# Patient Record
Sex: Male | Born: 1960 | Race: White | Hispanic: No | Marital: Married | Smoking: Former smoker
Health system: Southern US, Community
[De-identification: ages and names within clinical notes are randomized; demographics above are authoritative.]

## PROBLEM LIST (undated history)

## (undated) DIAGNOSIS — G4730 Sleep apnea, unspecified: Secondary | ICD-10-CM

## (undated) DIAGNOSIS — F419 Anxiety disorder, unspecified: Secondary | ICD-10-CM

## (undated) DIAGNOSIS — K219 Gastro-esophageal reflux disease without esophagitis: Secondary | ICD-10-CM

## (undated) DIAGNOSIS — M1990 Unspecified osteoarthritis, unspecified site: Secondary | ICD-10-CM

## (undated) DIAGNOSIS — I10 Essential (primary) hypertension: Secondary | ICD-10-CM

## (undated) HISTORY — PX: KNEE ARTHROSCOPY: SUR90

---

## 1997-04-30 HISTORY — PX: KNEE SURGERY: SHX244

## 2005-04-30 HISTORY — PX: HERNIA REPAIR: SHX51

## 2010-04-28 ENCOUNTER — Ambulatory Visit
Admission: RE | Admit: 2010-04-28 | Discharge: 2010-04-28 | Payer: Self-pay | Source: Home / Self Care | Attending: Orthopedic Surgery | Admitting: Orthopedic Surgery

## 2010-04-30 HISTORY — PX: OTHER SURGICAL HISTORY: SHX169

## 2015-07-29 ENCOUNTER — Other Ambulatory Visit: Payer: Self-pay | Admitting: Neurosurgery

## 2015-09-22 ENCOUNTER — Encounter (HOSPITAL_COMMUNITY): Payer: Self-pay

## 2015-09-22 ENCOUNTER — Encounter (HOSPITAL_COMMUNITY)
Admission: RE | Admit: 2015-09-22 | Discharge: 2015-09-22 | Disposition: A | Discharge status: Home / Self Care | Payer: BLUE CROSS/BLUE SHIELD | Source: Ambulatory Visit | Attending: Neurosurgery | Admitting: Neurosurgery

## 2015-09-22 ENCOUNTER — Other Ambulatory Visit (HOSPITAL_COMMUNITY): Payer: Self-pay | Admitting: *Deleted

## 2015-09-22 DIAGNOSIS — Z01818 Encounter for other preprocedural examination: Secondary | ICD-10-CM | POA: Diagnosis not present

## 2015-09-22 DIAGNOSIS — R001 Bradycardia, unspecified: Secondary | ICD-10-CM | POA: Insufficient documentation

## 2015-09-22 DIAGNOSIS — M4317 Spondylolisthesis, lumbosacral region: Secondary | ICD-10-CM | POA: Insufficient documentation

## 2015-09-22 DIAGNOSIS — Z0183 Encounter for blood typing: Secondary | ICD-10-CM | POA: Insufficient documentation

## 2015-09-22 DIAGNOSIS — Z01812 Encounter for preprocedural laboratory examination: Secondary | ICD-10-CM | POA: Diagnosis not present

## 2015-09-22 HISTORY — DX: Gastro-esophageal reflux disease without esophagitis: K21.9

## 2015-09-22 HISTORY — DX: Anxiety disorder, unspecified: F41.9

## 2015-09-22 HISTORY — DX: Unspecified osteoarthritis, unspecified site: M19.90

## 2015-09-22 HISTORY — DX: Essential (primary) hypertension: I10

## 2015-09-22 HISTORY — DX: Sleep apnea, unspecified: G47.30

## 2015-09-22 LAB — BASIC METABOLIC PANEL
ANION GAP: 8 (ref 5–15)
BUN: 13 mg/dL (ref 6–20)
CHLORIDE: 105 mmol/L (ref 101–111)
CO2: 25 mmol/L (ref 22–32)
CREATININE: 0.98 mg/dL (ref 0.61–1.24)
Calcium: 9.8 mg/dL (ref 8.9–10.3)
GFR calc non Af Amer: >60 mL/min (ref >60)
GLUCOSE: 107 mg/dL — AB (ref 65–99)
POTASSIUM: 3.8 mmol/L (ref 3.5–5.1)
Sodium: 138 mmol/L (ref 135–145)

## 2015-09-22 LAB — CBC
HEMATOCRIT: 44.8 % (ref 39.0–52.0)
Hemoglobin: 15.4 g/dL (ref 13.0–17.0)
MCH: 30.5 pg (ref 26.0–34.0)
MCHC: 34.4 g/dL (ref 30.0–36.0)
MCV: 88.7 fL (ref 78.0–100.0)
PLATELETS: 149 10*3/uL — AB (ref 150–400)
RBC: 5.05 MIL/uL (ref 4.22–5.81)
RDW: 12.9 % (ref 11.5–15.5)
WBC: 7.4 10*3/uL (ref 4.0–10.5)

## 2015-09-22 LAB — TYPE AND SCREEN
ABO/RH(D): O POS
Antibody Screen: NEGATIVE

## 2015-09-22 LAB — SURGICAL PCR SCREEN
MRSA, PCR: NEGATIVE
STAPHYLOCOCCUS AUREUS: NEGATIVE

## 2015-09-22 LAB — ABO/RH: ABO/RH(D): O POS

## 2015-09-22 NOTE — Progress Notes (Signed)
Pt denies any cardiac history.  States had a stress test many years ago >10 and it was normal. Unsure where or when.  PCP Dr Salvatore DecentPaul Settle   [place] Records requested.  EKG today.

## 2015-09-22 NOTE — Pre-Procedure Instructions (Signed)
    Seth MedalKevin Hampton  09/22/2015      [id number] [place] [facility]- [place], [place] - [address] [place] [id number] Phone: [phone] Fax: [phone]    Your procedure is scheduled on Tuesday June 6th  [facility] Admitting at [employer]6am  Call this number if you have problems the morning of surgery:  [phone]  If any questions prior to surgery you may call [phone] [phone] between 8 and 4.   Remember:  Do not eat food or drink liquids after midnight.  Take these medicines the morning of surgery with A SIP OF WATER: Omeprazole (prilosec), pain pill if needed, Tizanidine if needed Stop taking any aspirin or aspirin containing products, Nsaids (such as motrin, ibuprofen, aleve, naproxen), vitamins and herbal supplements 7 days prior to surgery   Do not wear jewelry  Do not wear lotions, or cologne.  You may wear deodorant.  Men may shave face and neck.  Do not bring valuables to the hospital.  [facility] is not responsible for any belongings or valuables.  Contacts, dentures or bridgework may not be worn into surgery.  Leave your suitcase in the car.  After surgery it may be brought to your room.  Special instructions: Shower with CHG the night before and morning of surgery with CHG as instructed  Please read over the following fact sheets that you were given. Coughing and Deep Breathing, Blood Transfusion Information and MRSA Information

## 2015-10-03 MED ORDER — CEFAZOLIN SODIUM-DEXTROSE 2-4 GM/100ML-% IV SOLN
2.0000 g | INTRAVENOUS | Status: AC
  Administered 2015-10-04: 2 g via INTRAVENOUS
  Filled 2015-10-03: qty 100

## 2015-10-04 ENCOUNTER — Inpatient Hospital Stay (HOSPITAL_COMMUNITY)
Admission: RE | Admit: 2015-10-04 | Discharge: 2015-10-06 | DRG: 460 | Disposition: A | Discharge status: Home / Self Care | Payer: BLUE CROSS/BLUE SHIELD | Source: Ambulatory Visit | Attending: Neurosurgery | Admitting: Neurosurgery

## 2015-10-04 ENCOUNTER — Encounter (HOSPITAL_COMMUNITY)
Admission: RE | Disposition: A | Discharge status: Home / Self Care | Payer: Self-pay | Source: Ambulatory Visit | Attending: Neurosurgery

## 2015-10-04 ENCOUNTER — Inpatient Hospital Stay (HOSPITAL_COMMUNITY): Payer: BLUE CROSS/BLUE SHIELD | Admitting: Certified Registered Nurse Anesthetist

## 2015-10-04 ENCOUNTER — Encounter (HOSPITAL_COMMUNITY): Payer: Self-pay | Admitting: Certified Registered Nurse Anesthetist

## 2015-10-04 ENCOUNTER — Inpatient Hospital Stay (HOSPITAL_COMMUNITY): Payer: BLUE CROSS/BLUE SHIELD

## 2015-10-04 DIAGNOSIS — M4316 Spondylolisthesis, lumbar region: Secondary | ICD-10-CM | POA: Diagnosis present

## 2015-10-04 DIAGNOSIS — M545 Low back pain: Secondary | ICD-10-CM | POA: Diagnosis present

## 2015-10-04 DIAGNOSIS — M4807 Spinal stenosis, lumbosacral region: Secondary | ICD-10-CM | POA: Diagnosis present

## 2015-10-04 DIAGNOSIS — Z419 Encounter for procedure for purposes other than remedying health state, unspecified: Secondary | ICD-10-CM

## 2015-10-04 HISTORY — PX: MAXIMUM ACCESS (MAS)POSTERIOR LUMBAR INTERBODY FUSION (PLIF) 1 LEVEL: SHX6368

## 2015-10-04 SURGERY — FOR MAXIMUM ACCESS (MAS) POSTERIOR LUMBAR INTERBODY FUSION (PLIF) 1 LEVEL
Anesthesia: General | Site: Back

## 2015-10-04 MED ORDER — FENTANYL CITRATE (PF) 100 MCG/2ML IJ SOLN
INTRAMUSCULAR | Status: DC | PRN
  Administered 2015-10-04: 50 ug via INTRAVENOUS
  Administered 2015-10-04 (×2): 100 ug via INTRAVENOUS
  Administered 2015-10-04 (×3): 50 ug via INTRAVENOUS
  Administered 2015-10-04: 100 ug via INTRAVENOUS

## 2015-10-04 MED ORDER — ACETAMINOPHEN 325 MG PO TABS: 650.0000 mg | ORAL_TABLET | ORAL | Status: DC | PRN

## 2015-10-04 MED ORDER — LIDOCAINE-EPINEPHRINE 1 %-1:100000 IJ SOLN
INTRAMUSCULAR | Status: DC | PRN
  Administered 2015-10-04: 10 mL

## 2015-10-04 MED ORDER — TIZANIDINE HCL 4 MG PO TABS: 2.0000 mg | ORAL_TABLET | Freq: Four times a day (QID) | ORAL | Status: DC | PRN

## 2015-10-04 MED ORDER — ALUM & MAG HYDROXIDE-SIMETH 200-200-20 MG/5ML PO SUSP: 30.0000 mL | Freq: Four times a day (QID) | ORAL | Status: DC | PRN

## 2015-10-04 MED ORDER — METHOCARBAMOL 500 MG PO TABS
500.0000 mg | ORAL_TABLET | Freq: Four times a day (QID) | ORAL | Status: DC | PRN
  Administered 2015-10-04 – 2015-10-06 (×6): 500 mg via ORAL
  Filled 2015-10-04 (×6): qty 1

## 2015-10-04 MED ORDER — DOCUSATE SODIUM 100 MG PO CAPS
100.0000 mg | ORAL_CAPSULE | Freq: Two times a day (BID) | ORAL | Status: DC
  Administered 2015-10-04 – 2015-10-06 (×4): 100 mg via ORAL
  Filled 2015-10-04 (×4): qty 1

## 2015-10-04 MED ORDER — BISACODYL 10 MG RE SUPP: 10.0000 mg | Freq: Every day | RECTAL | Status: DC | PRN

## 2015-10-04 MED ORDER — ONDANSETRON HCL 4 MG/2ML IJ SOLN
INTRAMUSCULAR | Status: DC | PRN
  Administered 2015-10-04: 4 mg via INTRAVENOUS

## 2015-10-04 MED ORDER — FENTANYL CITRATE (PF) 250 MCG/5ML IJ SOLN
INTRAMUSCULAR | Status: AC
  Filled 2015-10-04: qty 5

## 2015-10-04 MED ORDER — LACTATED RINGERS IV SOLN
INTRAVENOUS | Status: DC
  Administered 2015-10-04 (×3): via INTRAVENOUS

## 2015-10-04 MED ORDER — HYDROMORPHONE HCL 1 MG/ML IJ SOLN
0.5000 mg | INTRAMUSCULAR | Status: DC | PRN
  Administered 2015-10-04: 1 mg via INTRAVENOUS
  Filled 2015-10-04: qty 1

## 2015-10-04 MED ORDER — SENNOSIDES-DOCUSATE SODIUM 8.6-50 MG PO TABS
1.0000 | ORAL_TABLET | Freq: Every evening | ORAL | Status: DC | PRN
  Administered 2015-10-06: 1 via ORAL
  Filled 2015-10-04: qty 1

## 2015-10-04 MED ORDER — SODIUM CHLORIDE 0.9% FLUSH
3.0000 mL | Freq: Two times a day (BID) | INTRAVENOUS | Status: DC
Start: 2015-10-04 — End: 2015-10-06
  Administered 2015-10-04 – 2015-10-05 (×3): 3 mL via INTRAVENOUS

## 2015-10-04 MED ORDER — PANTOPRAZOLE SODIUM 40 MG PO TBEC
40.0000 mg | DELAYED_RELEASE_TABLET | Freq: Every day | ORAL | Status: DC
  Administered 2015-10-05 – 2015-10-06 (×2): 40 mg via ORAL
  Filled 2015-10-04 (×2): qty 1

## 2015-10-04 MED ORDER — SODIUM CHLORIDE 0.9 % IV SOLN: 250.0000 mL | INTRAVENOUS | Status: DC

## 2015-10-04 MED ORDER — KCL IN DEXTROSE-NACL 20-5-0.45 MEQ/L-%-% IV SOLN
INTRAVENOUS | Status: DC
Start: 2015-10-04 — End: 2015-10-06

## 2015-10-04 MED ORDER — SODIUM CHLORIDE 0.9% FLUSH: 3.0000 mL | INTRAVENOUS | Status: DC | PRN

## 2015-10-04 MED ORDER — PROPOFOL 10 MG/ML IV BOLUS
INTRAVENOUS | Status: AC
  Filled 2015-10-04: qty 20

## 2015-10-04 MED ORDER — LACTATED RINGERS IV SOLN: INTRAVENOUS | Status: DC

## 2015-10-04 MED ORDER — PROMETHAZINE HCL 25 MG/ML IJ SOLN
INTRAMUSCULAR | Status: AC
  Filled 2015-10-04: qty 1

## 2015-10-04 MED ORDER — ONDANSETRON HCL 4 MG/2ML IJ SOLN
4.0000 mg | INTRAMUSCULAR | Status: DC | PRN
  Administered 2015-10-05 – 2015-10-06 (×2): 4 mg via INTRAVENOUS
  Filled 2015-10-04 (×2): qty 2

## 2015-10-04 MED ORDER — CEFAZOLIN SODIUM-DEXTROSE 2-4 GM/100ML-% IV SOLN
2.0000 g | Freq: Three times a day (TID) | INTRAVENOUS | Status: AC
  Administered 2015-10-04 – 2015-10-05 (×2): 2 g via INTRAVENOUS
  Filled 2015-10-04 (×2): qty 100

## 2015-10-04 MED ORDER — MIDAZOLAM HCL 5 MG/5ML IJ SOLN
INTRAMUSCULAR | Status: DC | PRN
  Administered 2015-10-04: 2 mg via INTRAVENOUS

## 2015-10-04 MED ORDER — MENTHOL 3 MG MT LOZG: 1.0000 | LOZENGE | OROMUCOSAL | Status: DC | PRN

## 2015-10-04 MED ORDER — OXYCODONE-ACETAMINOPHEN 5-325 MG PO TABS: 1.0000 | ORAL_TABLET | Freq: Four times a day (QID) | ORAL | Status: DC | PRN

## 2015-10-04 MED ORDER — SUCCINYLCHOLINE CHLORIDE 20 MG/ML IJ SOLN
INTRAMUSCULAR | Status: DC | PRN
  Administered 2015-10-04: 160 mg via INTRAVENOUS

## 2015-10-04 MED ORDER — 0.9 % SODIUM CHLORIDE (POUR BTL) OPTIME
TOPICAL | Status: DC | PRN
  Administered 2015-10-04: 1000 mL

## 2015-10-04 MED ORDER — BUPIVACAINE LIPOSOME 1.3 % IJ SUSP
20.0000 mL | INTRAMUSCULAR | Status: DC
  Filled 2015-10-04: qty 20

## 2015-10-04 MED ORDER — HYDROCODONE-ACETAMINOPHEN 5-325 MG PO TABS: 1.0000 | ORAL_TABLET | ORAL | Status: DC | PRN

## 2015-10-04 MED ORDER — MIDAZOLAM HCL 2 MG/2ML IJ SOLN
INTRAMUSCULAR | Status: AC
  Filled 2015-10-04: qty 2

## 2015-10-04 MED ORDER — FAMOTIDINE IN NACL 20-0.9 MG/50ML-% IV SOLN
20.0000 mg | Freq: Two times a day (BID) | INTRAVENOUS | Status: DC
  Administered 2015-10-04 – 2015-10-05 (×2): 20 mg via INTRAVENOUS
  Filled 2015-10-04 (×6): qty 50

## 2015-10-04 MED ORDER — ARTIFICIAL TEARS OP OINT
TOPICAL_OINTMENT | OPHTHALMIC | Status: DC | PRN
  Administered 2015-10-04: 1 via OPHTHALMIC

## 2015-10-04 MED ORDER — HYDROMORPHONE HCL 1 MG/ML IJ SOLN: 0.2500 mg | INTRAMUSCULAR | Status: DC | PRN

## 2015-10-04 MED ORDER — BUPIVACAINE HCL (PF) 0.5 % IJ SOLN
INTRAMUSCULAR | Status: DC | PRN
  Administered 2015-10-04: 10 mL

## 2015-10-04 MED ORDER — THROMBIN 20000 UNITS EX SOLR
CUTANEOUS | Status: DC | PRN
  Administered 2015-10-04: 12:00:00 via TOPICAL

## 2015-10-04 MED ORDER — MIDAZOLAM HCL 2 MG/2ML IJ SOLN: 0.5000 mg | Freq: Once | INTRAMUSCULAR | Status: DC | PRN

## 2015-10-04 MED ORDER — MEPERIDINE HCL 25 MG/ML IJ SOLN: 6.2500 mg | INTRAMUSCULAR | Status: DC | PRN

## 2015-10-04 MED ORDER — METHOCARBAMOL 1000 MG/10ML IJ SOLN
500.0000 mg | Freq: Four times a day (QID) | INTRAVENOUS | Status: DC | PRN
  Filled 2015-10-04: qty 5

## 2015-10-04 MED ORDER — PHENOL 1.4 % MT LIQD: 1.0000 | OROMUCOSAL | Status: DC | PRN

## 2015-10-04 MED ORDER — PROPOFOL 10 MG/ML IV BOLUS
INTRAVENOUS | Status: DC | PRN
  Administered 2015-10-04: 70 mg via INTRAVENOUS
  Administered 2015-10-04: 200 mg via INTRAVENOUS
  Administered 2015-10-04: 30 mg via INTRAVENOUS

## 2015-10-04 MED ORDER — FLEET ENEMA 7-19 GM/118ML RE ENEM: 1.0000 | ENEMA | Freq: Once | RECTAL | Status: DC | PRN

## 2015-10-04 MED ORDER — ACETAMINOPHEN 650 MG RE SUPP: 650.0000 mg | RECTAL | Status: DC | PRN

## 2015-10-04 MED ORDER — ZOLPIDEM TARTRATE 5 MG PO TABS: 5.0000 mg | ORAL_TABLET | Freq: Every evening | ORAL | Status: DC | PRN

## 2015-10-04 MED ORDER — PROMETHAZINE HCL 25 MG/ML IJ SOLN
6.2500 mg | INTRAMUSCULAR | Status: DC | PRN
  Administered 2015-10-04: 6.25 mg via INTRAVENOUS

## 2015-10-04 MED ORDER — OXYCODONE-ACETAMINOPHEN 5-325 MG PO TABS
1.0000 | ORAL_TABLET | ORAL | Status: DC | PRN
  Administered 2015-10-04 – 2015-10-06 (×9): 2 via ORAL
  Filled 2015-10-04 (×9): qty 2

## 2015-10-04 MED ORDER — EPHEDRINE SULFATE 50 MG/ML IJ SOLN
INTRAMUSCULAR | Status: DC | PRN
  Administered 2015-10-04: 15 mg via INTRAVENOUS
  Administered 2015-10-04: 10 mg via INTRAVENOUS

## 2015-10-04 MED ORDER — LIDOCAINE HCL (CARDIAC) 20 MG/ML IV SOLN
INTRAVENOUS | Status: DC | PRN
  Administered 2015-10-04: 20 mg via INTRAVENOUS

## 2015-10-04 SURGICAL SUPPLY — 87 items
BENZOIN TINCTURE PRP APPL 2/3 (GAUZE/BANDAGES/DRESSINGS) ×3 IMPLANT
BIT DRILL PLIF MAS 5.0MM DISP (DRILL) ×1 IMPLANT
BLADE CLIPPER SURG (BLADE) IMPLANT
BONE CANC CHIPS 40CC CAN1/2 (Bone Implant) ×3 IMPLANT
BUR MATCHSTICK NEURO 3.0 LAGG (BURR) ×3 IMPLANT
BUR ROUND FLUTED 5 RND (BURR) ×2 IMPLANT
BUR ROUND FLUTED 5MM RND (BURR) ×1
CAGE COROENT 11X9X23-8 (Cage) ×6 IMPLANT
CANISTER SUCT 3000ML PPV (MISCELLANEOUS) ×3 IMPLANT
CHIPS CANC BONE 40CC CAN1/2 (Bone Implant) ×1 IMPLANT
CLIP NEUROVISION LG (CLIP) ×3 IMPLANT
CLOSURE WOUND 1/2 X4 (GAUZE/BANDAGES/DRESSINGS) ×1
CONT SPEC 4OZ CLIKSEAL STRL BL (MISCELLANEOUS) ×3 IMPLANT
COVER BACK TABLE 24X17X13 BIG (DRAPES) IMPLANT
COVER BACK TABLE 60X90IN (DRAPES) ×3 IMPLANT
DECANTER SPIKE VIAL GLASS SM (MISCELLANEOUS) ×3 IMPLANT
DERMABOND ADVANCED (GAUZE/BANDAGES/DRESSINGS) ×2
DERMABOND ADVANCED .7 DNX12 (GAUZE/BANDAGES/DRESSINGS) ×1 IMPLANT
DRAPE C-ARM 42X72 X-RAY (DRAPES) ×3 IMPLANT
DRAPE C-ARMOR (DRAPES) ×3 IMPLANT
DRAPE LAPAROTOMY 100X72X124 (DRAPES) ×3 IMPLANT
DRAPE POUCH INSTRU U-SHP 10X18 (DRAPES) ×3 IMPLANT
DRAPE SURG 17X23 STRL (DRAPES) ×3 IMPLANT
DRILL PLIF MAS 5.0MM DISP (DRILL) ×3
DRSG OPSITE POSTOP 4X6 (GAUZE/BANDAGES/DRESSINGS) ×3 IMPLANT
DURAPREP 26ML APPLICATOR (WOUND CARE) ×3 IMPLANT
ELECT REM PT RETURN 9FT ADLT (ELECTROSURGICAL) ×3
ELECTRODE REM PT RTRN 9FT ADLT (ELECTROSURGICAL) ×1 IMPLANT
EVACUATOR 1/8 PVC DRAIN (DRAIN) ×3 IMPLANT
GAUZE SPONGE 4X4 12PLY STRL (GAUZE/BANDAGES/DRESSINGS) ×3 IMPLANT
GAUZE SPONGE 4X4 16PLY XRAY LF (GAUZE/BANDAGES/DRESSINGS) IMPLANT
GLOVE BIO SURGEON STRL SZ8 (GLOVE) ×3 IMPLANT
GLOVE BIOGEL PI IND STRL 6.5 (GLOVE) ×3 IMPLANT
GLOVE BIOGEL PI IND STRL 7.0 (GLOVE) ×1 IMPLANT
GLOVE BIOGEL PI IND STRL 8 (GLOVE) ×2 IMPLANT
GLOVE BIOGEL PI IND STRL 8.5 (GLOVE) ×2 IMPLANT
GLOVE BIOGEL PI INDICATOR 6.5 (GLOVE) ×6
GLOVE BIOGEL PI INDICATOR 7.0 (GLOVE) ×2
GLOVE BIOGEL PI INDICATOR 8 (GLOVE) ×4
GLOVE BIOGEL PI INDICATOR 8.5 (GLOVE) ×4
GLOVE ECLIPSE 7.0 STRL STRAW (GLOVE) ×3 IMPLANT
GLOVE ECLIPSE 8.0 STRL XLNG CF (GLOVE) ×3 IMPLANT
GLOVE EXAM NITRILE LRG STRL (GLOVE) IMPLANT
GLOVE EXAM NITRILE MD LF STRL (GLOVE) IMPLANT
GLOVE EXAM NITRILE XL STR (GLOVE) IMPLANT
GLOVE EXAM NITRILE XS STR PU (GLOVE) IMPLANT
GLOVE INDICATOR 6.5 STRL GRN (GLOVE) ×6 IMPLANT
GLOVE INDICATOR 7.5 STRL GRN (GLOVE) ×9 IMPLANT
GOWN STRL REUS W/ TWL LRG LVL3 (GOWN DISPOSABLE) IMPLANT
GOWN STRL REUS W/ TWL XL LVL3 (GOWN DISPOSABLE) ×1 IMPLANT
GOWN STRL REUS W/TWL 2XL LVL3 (GOWN DISPOSABLE) ×3 IMPLANT
GOWN STRL REUS W/TWL LRG LVL3 (GOWN DISPOSABLE)
GOWN STRL REUS W/TWL XL LVL3 (GOWN DISPOSABLE) ×2
KIT BASIN OR (CUSTOM PROCEDURE TRAY) ×3 IMPLANT
KIT POSITION SURG JACKSON T1 (MISCELLANEOUS) ×3 IMPLANT
KIT ROOM TURNOVER OR (KITS) ×3 IMPLANT
MILL MEDIUM DISP (BLADE) ×6 IMPLANT
MODULE NVM5 NEXT GEN EMG (NEEDLE) ×3 IMPLANT
NEEDLE HYPO 25X1 1.5 SAFETY (NEEDLE) ×3 IMPLANT
NEEDLE SPNL 18GX3.5 QUINCKE PK (NEEDLE) IMPLANT
NS IRRIG 1000ML POUR BTL (IV SOLUTION) ×3 IMPLANT
PACK LAMINECTOMY NEURO (CUSTOM PROCEDURE TRAY) ×3 IMPLANT
PAD ARMBOARD 7.5X6 YLW CONV (MISCELLANEOUS) ×9 IMPLANT
PATTIES SURGICAL .5 X.5 (GAUZE/BANDAGES/DRESSINGS) IMPLANT
PATTIES SURGICAL .5 X1 (DISPOSABLE) IMPLANT
PATTIES SURGICAL 1X1 (DISPOSABLE) IMPLANT
ROD PREBENT 45MM LUMBAR (Rod) ×6 IMPLANT
SCREW LOCK (Screw) ×8 IMPLANT
SCREW LOCK FXNS SPNE MAS PL (Screw) ×4 IMPLANT
SCREW SHANK 5.5X40MM (Screw) ×6 IMPLANT
SCREW SHANK 6.5X40 (Screw) ×4 IMPLANT
SCREW SHANK 6.5X40 NS LF (Screw) ×2 IMPLANT
SCREW TULIP 5.5 (Screw) ×6 IMPLANT
SPONGE LAP 4X18 X RAY DECT (DISPOSABLE) IMPLANT
SPONGE SURGIFOAM ABS GEL 100 (HEMOSTASIS) ×3 IMPLANT
STAPLER SKIN PROX WIDE 3.9 (STAPLE) IMPLANT
STRIP CLOSURE SKIN 1/2X4 (GAUZE/BANDAGES/DRESSINGS) ×2 IMPLANT
SUT VIC AB 1 CT1 18XBRD ANBCTR (SUTURE) ×2 IMPLANT
SUT VIC AB 1 CT1 8-18 (SUTURE) ×4
SUT VIC AB 2-0 CT1 18 (SUTURE) ×6 IMPLANT
SUT VIC AB 3-0 SH 8-18 (SUTURE) ×6 IMPLANT
SYR 5ML LL (SYRINGE) IMPLANT
TOWEL OR 17X24 6PK STRL BLUE (TOWEL DISPOSABLE) ×3 IMPLANT
TOWEL OR 17X26 10 PK STRL BLUE (TOWEL DISPOSABLE) ×3 IMPLANT
TRAP SPECIMEN MUCOUS 40CC (MISCELLANEOUS) ×3 IMPLANT
TRAY FOLEY W/METER SILVER 16FR (SET/KITS/TRAYS/PACK) ×3 IMPLANT
WATER STERILE IRR 1000ML POUR (IV SOLUTION) ×3 IMPLANT

## 2015-10-04 NOTE — OR Nursing (Signed)
nuvasive nerve monitoring electrodes applied to upper and lower extermity and upper torso prior to positioning

## 2015-10-04 NOTE — OR Nursing (Signed)
nuvasive needle electrodes removed at end of procedure  

## 2015-10-04 NOTE — Brief Op Note (Signed)
10/04/2015  4:07 PM  PATIENT:  Seth MedalKevin Axelson  55 y.o. male  PRE-OPERATIVE DIAGNOSIS:  Spondylolisthesis, Lumbosacral region L 5 S 1, herniated disc, spondylosis, stenosis, radiculopathy  POST-OPERATIVE DIAGNOSIS:  Spondylolisthesis, Lumbosacral region L 5 S 1, herniated disc, spondylosis, stenosis, radiculopathy  PROCEDURE:  Procedure(s): Lumbar five-sacral one Maximum access posterior lumbar interbody fusion (N/A) with PEEK cages, autograft, allograft, pedicle screw fixation, posterolateral arthrodesis  Decompression greater than standard PLIF procedure   SURGEON:  Surgeon(s) and Role:    * Maeola HarmanJoseph Miriam Kestler, MD - Primary    * Lisbeth RenshawNeelesh Nundkumar, MD - Assisting  PHYSICIAN ASSISTANT:   ASSISTANTS: Poteat, RN   ANESTHESIA:   general  EBL:  Total I/O In: 1600 [I.V.:1600] Out: 295 [Urine:95; Blood:200]  BLOOD ADMINISTERED:none  DRAINS: none   LOCAL MEDICATIONS USED:  MARCAINE    and LIDOCAINE   SPECIMEN:  No Specimen  DISPOSITION OF SPECIMEN:  N/A  COUNTS:  YES  TOURNIQUET:  * No tourniquets in log *  DICTATION: Patient is a 55 year old with spondylosis , stenosis, spondylolisthesis, disc herniation and severe back and right lower extremity pain at L5 S 1 levels of the lumbar spine. It was elected to take him to surgery for MASPLIF L 5 S 1 level with posterolateral arthrodesis.  Procedure:   Following uncomplicated induction of GETA, and placement of electrodes for neural monitoring, patient was turned into a prone position on the [place] tableand using AP  fluoroscopy the area of planned incision was marked, prepped with betadine scrub and Duraprep, then draped. Exposure was performed of facet joint complex at L 5 S 1 level and the MAS retractor was placed. 5.5 x 40 mm cortical Nuvasive screws were placed at L 5 bilaterally according to standard landmarks using neural monitoring.  A total laminectomy of L 5 was then performed with disarticulation of facets.  This bone was saved  for grafting, combined with allograft after being run through bone mill and was placed in bone packing device.  Thorough discectomy was performed bilaterally at L 5 S 1 and the endplates were prepared for grafting.  Decompression was greater than for standard PLIF procedure.  There was a lateral bone spur and calcified disc herniation on the right with severe nerve root compression.  The L 5 and S  1 nerve roots were widely decompressed bilaterally and the bone spurs were removed with osteophyte tool and chisels.  23 x 11 x 8 degree cages were placed in the interspace and positioning was confirmed with AP and lateral fluoroscopy.  10 cc of autograft/allograft was packed in the interspace medial to the second cage.   Remaining screws (6.5 x 40 mm) were placed at S 1 and 45 mm rods were placed.  And the screws were locked and torqued. Final Xrays showed well positioned implants and screw fixation. The posterolateral region was packed with remaining 15 cc of autograft on the right and on the left of midline. The wounds were irrigated and then closed with 1, 2-0 and 3-0 Vicryl stitches. Sterile occlusive dressing was placed with Dermabond and an occlusive dressing. The patient was then extubated in the operating room and taken to recovery in stable and satisfactory condition having tolerated his operation well. Counts were correct at the end of the case.  PLAN OF CARE: Admit to inpatient   PATIENT DISPOSITION:  PACU - hemodynamically stable.   Delay start of Pharmacological VTE agent (>24hrs) due to surgical blood loss or risk of bleeding: yes

## 2015-10-04 NOTE — Transfer of Care (Signed)
Immediate Anesthesia Transfer of Care Note  Patient: Seth Hampton  Procedure(s) Performed: Procedure(s): Lumbar five-sacral one Maximum access posterior lumbar interbody fusion (N/A)  Patient Location: PACU  Anesthesia Type:General  Level of Consciousness: awake, alert , oriented and patient cooperative  Airway & Oxygen Therapy: Patient Spontanous Breathing and Patient connected to nasal cannula oxygen  Post-op Assessment: Report given to RN and Post -op Vital signs reviewed and stable  Post vital signs: Reviewed and stable  Last Vitals:  Filed Vitals:   10/04/15 1019  BP: 146/92  Pulse: 63  Temp: 36.6 C  Resp: 20    Last Pain: There were no vitals filed for this visit.       Complications: No apparent anesthesia complications

## 2015-10-04 NOTE — Progress Notes (Signed)
Awake, alert, conversant.  MAEW with good strength.  Leg pain improved.  Doing well.  

## 2015-10-04 NOTE — Op Note (Signed)
10/04/2015  4:07 PM  PATIENT:  Seth Hampton  55 y.o. male  PRE-OPERATIVE DIAGNOSIS:  Spondylolisthesis, Lumbosacral region L 5 S 1, herniated disc, spondylosis, stenosis, radiculopathy  POST-OPERATIVE DIAGNOSIS:  Spondylolisthesis, Lumbosacral region L 5 S 1, herniated disc, spondylosis, stenosis, radiculopathy  PROCEDURE:  Procedure(s): Lumbar five-sacral one Maximum access posterior lumbar interbody fusion (N/A) with PEEK cages, autograft, allograft, pedicle screw fixation, posterolateral arthrodesis  Decompression greater than standard PLIF procedure   SURGEON:  Surgeon(s) and Role:    * Jesiah Yerby, MD - Primary    * Neelesh Nundkumar, MD - Assisting  PHYSICIAN ASSISTANT:   ASSISTANTS: Poteat, RN   ANESTHESIA:   general  EBL:  Total I/O In: 1600 [I.V.:1600] Out: 295 [Urine:95; Blood:200]  BLOOD ADMINISTERED:none  DRAINS: none   LOCAL MEDICATIONS USED:  MARCAINE    and LIDOCAINE   SPECIMEN:  No Specimen  DISPOSITION OF SPECIMEN:  N/A  COUNTS:  YES  TOURNIQUET:  * No tourniquets in log *  DICTATION: Patient is a 55-year-old with spondylosis , stenosis, spondylolisthesis, disc herniation and severe back and right lower extremity pain at L5 S 1 levels of the lumbar spine. It was elected to take him to surgery for MASPLIF L 5 S 1 level with posterolateral arthrodesis.  Procedure:   Following uncomplicated induction of GETA, and placement of electrodes for neural monitoring, patient was turned into a prone position on the [place] tableand using AP  fluoroscopy the area of planned incision was marked, prepped with betadine scrub and Duraprep, then draped. Exposure was performed of facet joint complex at L 5 S 1 level and the MAS retractor was placed. 5.5 x 40 mm cortical Nuvasive screws were placed at L 5 bilaterally according to standard landmarks using neural monitoring.  A total laminectomy of L 5 was then performed with disarticulation of facets.  This bone was saved  for grafting, combined with allograft after being run through bone mill and was placed in bone packing device.  Thorough discectomy was performed bilaterally at L 5 S 1 and the endplates were prepared for grafting.  Decompression was greater than for standard PLIF procedure.  There was a lateral bone spur and calcified disc herniation on the right with severe nerve root compression.  The L 5 and S  1 nerve roots were widely decompressed bilaterally and the bone spurs were removed with osteophyte tool and chisels.  23 x 11 x 8 degree cages were placed in the interspace and positioning was confirmed with AP and lateral fluoroscopy.  10 cc of autograft/allograft was packed in the interspace medial to the second cage.   Remaining screws (6.5 x 40 mm) were placed at S 1 and 45 mm rods were placed.  And the screws were locked and torqued. Final Xrays showed well positioned implants and screw fixation. The posterolateral region was packed with remaining 15 cc of autograft on the right and on the left of midline. The wounds were irrigated and then closed with 1, 2-0 and 3-0 Vicryl stitches. Sterile occlusive dressing was placed with Dermabond and an occlusive dressing. The patient was then extubated in the operating room and taken to recovery in stable and satisfactory condition having tolerated his operation well. Counts were correct at the end of the case.  PLAN OF CARE: Admit to inpatient   PATIENT DISPOSITION:  PACU - hemodynamically stable.   Delay start of Pharmacological VTE agent (>24hrs) due to surgical blood loss or risk of bleeding: yes  

## 2015-10-04 NOTE — H&P (Signed)
Patient ID:   [id number] Patient: Seth Hampton  Date of Birth: 1960/12/24 Visit Type: Office Visit   Date: 07/28/2015 12:00 PM Provider: Danae Orleans. Venetia Maxon MD   This 55 year old male presents for back pain.  History of Present Illness: 1.  back pain    06/28/15 right L5 SNRB offered two days relief  Patient reports lumbar and right leg pain has been gradually increasing since two days post injection.  Lately, pain has been significant, preventing any activity.  Only position of comfort is in a recliner with legs elevated.   Tramadol 50 mg 1-2/day Ibuprofen 800 mg 2-4/day  The patient has not had significant relief after the injection wore off although he did get good relief for 2 days after the injection.  The patient has evidence of weakness in his EHL on the right and also hip abductor on the right.  He is a positive straight leg raise on the right.  He has L5 distribution of numbness.  I reviewed his imaging studies and this shows spondylolisthesis of L5 on S1 with severe foraminal stenosis at L5-S1 with right L5 nerve root compression within outside of the foramen.  The listhesis is greater on the right than the left.  There is some spondylosis at L4 L5 but this does not seem to be as severe.  I recommended based on the patient's severe pain and minimal sustained relief with injection that we proceed with surgery.  This will consist of decompression and fusion at the L5-S1 level.  He will not be possible to sufficiently decompress his L5 nerve root within the foraminal and extraforaminal region without increasing the foraminal height and decompressing the spondylolisthesis with significant foraminal stenosis at this level.  I do not believe that the L4 L5 level it is severely affected.  I have advised the patient of the importance of weight control.      Medical/Surgical/Interim History Reviewed, no change.  Last detailed document date:06/27/2015.   PAST MEDICAL HISTORY,  SURGICAL HISTORY, FAMILY HISTORY, SOCIAL HISTORY AND REVIEW OF SYSTEMS I have reviewed the patient's past medical, surgical, family and social history as well as the comprehensive review of systems as included on the [facility] history form dated 06/27/2015, which I have signed.  Family History: Reviewed, no changes.  Last detailed document: 06/27/2015.   Social History: Tobacco use reviewed. Reviewed, no changes. Last detailed document date: 06/27/2015.      MEDICATIONS(added, continued or stopped this visit): Started Medication Directions Instruction Stopped   ibuprofen 200 mg tablet take 1 tablet by oral route  every 6 hours as needed with food     omeprazole 20 mg capsule,delayed release take 1 capsule by oral route  every day 30 minutes to 1 hour before a meal    07/28/2015 Percocet 5 mg-325 mg tablet take 1-2 po q6hrs prn pain    07/28/2015 tizanidine 2 mg tablet take 1-2 po q8hrs prn spasm     tramadol 50 mg tablet take 1 tablet by oral route  every 6 hours as needed       ALLERGIES: Ingredient Reaction Medication Name Comment  NO KNOWN ALLERGIES     No known allergies.    Vitals Date Temp F BP Pulse Ht In Wt Lb BMI BSA Pain Score  07/28/2015  151/95 58 72 250 33.91  10/10      IMPRESSION Severe recurrent right L5 radiculopathy with minimal relief after injection.  Patient has disc herniation with spondylolisthesis  and stenosis L5-S1 on the right  Completed Orders (this encounter) Order Details Reason Side Interpretation Result Initial Treatment Date Region  Dietary management education, guidance, and counseling Encouraged to eat a well balanced diet and follow up with primary care physician.        Hypertension education Continue to monitor blood pressure.  If blood pressure remains elevated, contact your primary care physician         Assessment/Plan # Detail Type Description   1. Assessment Spondylolisthesis, lumbosacral region  (M43.17).       2. 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       3. Assessment Low back pain, unspecified back pain laterality, with sciatica presence unspecified (M54.5).       4. 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       5. Assessment Lumbar radiculopathy (M54.16).       6. Assessment Body mass index (BMI) 33.0-33.9, adult (Z61.09(Z68.33).   Plan Orders Today's instructions / counseling include(s) Dietary management education, guidance, and counseling.       7. Assessment Elevated blood-pressure reading, w/o diagnosis of htn (R03.0).         Pain Assessment/Treatment Pain Scale: 10/10. Method: Numeric Pain Intensity Scale. Location: back. Onset: 02/27/2015. Duration: varies. Quality: discomforting. Pain Assessment/Treatment follow-up plan of care: Patient taking medications as prescribed..  Fall Risk Plan The patient has not fallen in the last year.  I have recommended decompression and fusion at the L5-S1 level.  Risks and benefits were discussed in detail with the patient and he wishes to proceed with surgery.  He was fitted for an LSO brace.  Patient teaching was performed  Orders: Diagnostic Procedures: Assessment Procedure  M43.17 Lumbar Spine- AP/Lat  Instruction(s)/Education: Assessment Instruction  R03.0 Hypertension education  [phone] Dietary management education, guidance, and counseling    MEDICATIONS PRESCRIBED TODAY    Rx Quantity Refills  TIZANIDINE HCL 2 mg  60 0  PERCOCET 5 mg-325 mg  60 0            Provider:  Danae OrleansJoseph D. Venetia MaxonStern MD  07/28/2015 02:17 PM Dictation edited by: Danae OrleansJoseph D. Venetia MaxonStern    CC Providers: Salvatore DecentPaul Settle [address] [place], [place] [id number]              Electronically signed by Danae OrleansJoseph D. Venetia MaxonStern MD on 07/28/2015 02:17 PM  Patient ID:   [id number] Patient: Seth Hampton  Date of Birth: 12-Jul-1960 Visit Type: Office Visit   Date: 06/27/2015 03:30 PM Provider: Danae OrleansJoseph D. Venetia MaxonStern  MD   This 55 year old male presents for back pain.  History of Present Illness: 1.  back pain  Seth Hampton, 55 year old male employed with [employer], visits for evaluation reporting right leg pain numbness and tingling since October.  Symptoms began upon standing from a seated position at his desk.  Steroid Dosepak and steroid injection from his primary care physician offered three days relief in November. Pain is worsens when sitting for long periods of time, like driving. Pain does not radiate to shins.   Tramadol 50 mg as needed Ibuprofen 200 mg as needed  Chiropractic offered no help, increased pain  History: HTN, GERD, osteoporosis, scoliosis Surgical history: Right knee 1999, left knee 2007, hernia repair 2009  MRI on [place]. Moderate right foraminal stenosis at L5-S1 on the right related to a focal disc protrusion, causing mild nerve root impingement. Mild bilateral foraminal stenosis at L4-5 without nerve root impingement.  X-ray today indicates:  PAST MEDICAL/SURGICAL HISTORY   (Detailed)  Disease/disorder Onset Date Management Date Comments    knee surgery- bilateral  TKJ 06/27/2015 -    Hernia repair    Anxiety      Arthritis    TKJ 06/27/2015 -  Hypertension         PAST MEDICAL HISTORY, SURGICAL HISTORY, FAMILY HISTORY, SOCIAL HISTORY AND REVIEW OF SYSTEMS I have reviewed the patient's past medical, surgical, family and social history as well as the comprehensive review of systems as included on the [facility] history form dated 06/27/2015, which I have signed.  Family History  (Detailed) Relationship Family Member Name Deceased Age at Death Condition Onset Age Cause of Death  Mother    Polymyalgia rheumatica  N    SOCIAL HISTORY  (Detailed) Tobacco use reviewed. Preferred language is Unknown.   Smoking status: Never smoker.  SMOKING STATUS Use Status Type Smoking Status Usage Per Day Years Used Total Pack  Years  no/never  Never smoker       HOME ENVIRONMENT/SAFETY The patient has not fallen in the last year.        MEDICATIONS(added, continued or stopped this visit): Started Medication Directions Instruction Stopped   ibuprofen 200 mg tablet take 1 tablet by oral route  every 6 hours as needed with food     omeprazole 20 mg capsule,delayed release take 1 capsule by oral route  every day 30 minutes to 1 hour before a meal     tramadol 50 mg tablet take 1 tablet by oral route  every 6 hours as needed       ALLERGIES: Ingredient Reaction Medication Name Comment  NO KNOWN ALLERGIES     No known allergies.   REVIEW OF SYSTEMS System Neg/Pos Details  Constitutional Negative Chills, fatigue, fever, malaise, night sweats, weight gain and weight loss.  ENMT Positive Wears glasses.  ENMT Negative Ear drainage, hearing loss, nasal drainage, otalgia, sinus pressure and sore throat.  Eyes Negative Eye discharge, eye pain and vision changes.  Respiratory Negative Chronic cough, cough, dyspnea, known TB exposure and wheezing.  Cardio Positive High blood pressure.  Cardio Negative Chest pain, claudication, edema and irregular heartbeat/palpitations.  GI Negative Abdominal pain, blood in stool, change in stool pattern, constipation, decreased appetite, diarrhea, heartburn, nausea and vomiting.  GU Negative Dribbling, dysuria, erectile dysfunction, hematuria, polyuria, slow stream, urinary frequency, urinary incontinence and urinary retention.  Endocrine Negative Cold intolerance, heat intolerance, polydipsia and polyphagia.  Neuro Negative Dizziness, extremity weakness, gait disturbance, headache, memory impairment, numbness in extremity, seizures and tremors.  Psych Negative Anxiety, depression and insomnia.  Integumentary Negative Brittle hair, brittle nails, change in shape/size of mole(s), hair loss, hirsutism, hives, pruritus, rash and skin lesion.  MS Negative Back pain, joint pain,  joint swelling, muscle weakness and neck pain.  Hema/Lymph Negative Easy bleeding, easy bruising and lymphadenopathy.  Allergic/Immuno Negative Contact allergy, environmental allergies, food allergies and seasonal allergies.  Reproductive Negative Penile discharge and sexual dysfunction.     Vitals Date Temp F BP Pulse Ht In Wt Lb BMI BSA Pain Score  06/27/2015  132/92 98 72 249 33.77  6/10     PHYSICAL EXAM General Level of Distress: no acute distress Overall Appearance: normal  Head and Face  Right Left  Fundoscopic Exam:  normal normal    Cardiovascular Cardiac: regular rate and rhythm without murmur  Right Left  Carotid Pulses: normal normal  Respiratory Lungs: clear to auscultation  Neurological Orientation:  normal Recent and Remote Memory: normal Attention Span and Concentration:   normal Language: normal Fund of Knowledge: normal  Right Left Sensation: normal normal Upper Extremity Coordination: normal normal  Lower Extremity Coordination: normal normal  Musculoskeletal Gait and Station: normal  Right Left Upper Extremity Muscle Strength: normal normal Lower Extremity Muscle Strength: normal normal Upper Extremity Muscle Tone:  normal normal Lower Extremity Muscle Tone: normal normal  Motor Strength Upper and lower extremity motor strength was tested in the clinically pertinent muscles. Any abnormal findings will be noted below.   Right Left Tib Anterior: 4-/5  EHL: 4/5    Deep Tendon Reflexes  Right Left Biceps: normal normal Triceps: normal normal Brachiloradialis: normal normal Patellar: normal normal Achilles: normal normal  Sensory Sensation was tested at L1 to S1.   Cranial Nerves II. Optic Nerve/Visual Fields: normal III. Oculomotor: normal IV. Trochlear: normal V. Trigeminal: normal VI. Abducens: normal VII. Facial: normal VIII. Acoustic/Vestibular: normal IX. Glossopharyngeal: normal X. Vagus: normal XI. Spinal  Accessory: normal XII. Hypoglossal: normal  Motor and other Tests Lhermittes: negative Rhomberg: negative Pronator drift: absent     Right Left Hoffman's: normal normal Clonus: normal normal Babinski: normal normal SLR: negative negative Patrick's Pearlean Brownie): negative negative Toe Walk: normal normal Toe Lift: normal normal Heel Walk: normal normal SI Joint: nontender nontender   Additional Findings:  12 inches from floor when bending over. Weak in hip abductors, right at 4/5.  Numbness in EHL at right.   DIAGNOSTIC RESULTS MRI: Moderate right foraminal stenosis at L5-S1 on the right related to a focal disc protrusion, causing mild nerve root impingement. Mild bilateral foraminal stenosis at L4-5 without nerve root impingement.   X-Ray: AP film lateral spurring on L2-3 and L4-5 with mild scoliosis.     IMPRESSION  The patient has L5 radiculopthy with spondylolisthesis. A right side L5 nerve block and injection will likely alleviate his pain  Completed Orders (this encounter) Order Details Reason Side Interpretation Result Initial Treatment Date Region  Dietary management education, guidance, and counseling Encouraged to eat a well balanced diet and follow up with primary care physician.        Hypertension education Follow up with primary care physician for elevated blood pressure.        Lumbar Spine- AP/Lat/Flex/Ex      06/27/2015 All Levels to All Levels   Assessment/Plan # Detail Type Description   1. 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       2. Assessment Low back pain, unspecified back pain laterality, with sciatica presence unspecified (M54.5).       3. 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       4. Assessment Spondylolisthesis, lumbosacral region (M43.17).       5. Assessment Body mass index (BMI) 33.0-33.9, adult (Z61.09).   Plan Orders Today's instructions / counseling include(s) Dietary management education,  guidance, and counseling.       6. Assessment Essential (primary) hypertension (I10).       7. Assessment Lumbar radiculopathy (M54.16).         Pain Assessment/Treatment Pain Scale: 6/10. Method: Numeric Pain Intensity Scale. Location: back, leg. Onset: 02/27/2015. Duration: varies. Quality: discomforting. Pain Assessment/Treatment follow-up plan of care: Patient taking medication as prescribed.  Fall Risk Plan The patient has not fallen in the last year.  Right L5 nerve block tomorrow 06/28/15.  Orders: Office Procedures/Services: Assessment Service Comments  M54.16 TFESI - right - L5 JDS    Diagnostic Procedures: Assessment Procedure  M54.16 Lumbar Spine- AP/Lat/Flex/Ex  Instruction(s)/Education: Assessment Instruction  I10 Hypertension education  [phone] Dietary management education, guidance, and counseling             Provider:  Danae Orleans. Venetia Maxon MD  06/27/2015 04:41 PM Dictation edited by: Jamey Ripa    CC Providers: Salvatore Decent [address] [place], [place] [id number]              Electronically signed by Danae Orleans. Venetia Maxon MD on 06/28/2015 09:33 AM

## 2015-10-04 NOTE — Anesthesia Preprocedure Evaluation (Addendum)
Anesthesia Evaluation  Patient identified by MRN, date of birth, ID band Patient awake    Reviewed: Allergy & Precautions, NPO status , Patient's Chart, lab work & pertinent test results  History of Anesthesia Complications Negative for: history of anesthetic complications  Airway Mallampati: I  TM Distance: >3 FB Neck ROM: Full    Dental  (+) Teeth Intact, Caps, Dental Advisory Given, Chipped   Pulmonary sleep apnea and Continuous Positive Airway Pressure Ventilation , former smoker (quit 2001),    breath sounds clear to auscultation       Cardiovascular hypertension (no meds needed),  Rhythm:Regular Rate:Normal     Neuro/Psych Back pain: narcotics    GI/Hepatic Neg liver ROS, GERD  Medicated and Controlled,  Endo/Other  Morbid obesity  Renal/GU negative Renal ROS     Musculoskeletal  (+) Arthritis , Osteoarthritis,    Abdominal (+) + obese,   Peds  Hematology negative hematology ROS (+)   Anesthesia Other Findings   Reproductive/Obstetrics                            Anesthesia Physical Anesthesia Plan  ASA: III  Anesthesia Plan: General   Post-op Pain Management:    Induction: Intravenous  Airway Management Planned: Oral ETT  Additional Equipment:   Intra-op Plan:   Post-operative Plan: Extubation in OR  Informed Consent: I have reviewed the patients History and Physical, chart, labs and discussed the procedure including the risks, benefits and alternatives for the proposed anesthesia with the patient or authorized representative who has indicated his/her understanding and acceptance.   Dental advisory given  Plan Discussed with: CRNA and Surgeon  Anesthesia Plan Comments: (Plan routine monitors, GETA)        Anesthesia Quick Evaluation

## 2015-10-04 NOTE — Interval H&P Note (Signed)
History and Physical Interval Note:  10/04/2015 12:55 PM  Seth Hampton  has presented today for surgery, with the diagnosis of Spondylolisthesis, Lumbosacral region  The various methods of treatment have been discussed with the patient and family. After consideration of risks, benefits and other options for treatment, the patient has consented to  Procedure(s) with comments: L5-S1 Maximum access posterior lumbar interbody fusion (N/A) - L5-S1 Maximum access posterior lumbar interbody fusion as a surgical intervention .  The patient's history has been reviewed, patient examined, no change in status, stable for surgery.  I have reviewed the patient's chart and labs.  Questions were answered to the patient's satisfaction.     Elsbeth Yearick D

## 2015-10-05 ENCOUNTER — Encounter (HOSPITAL_COMMUNITY): Payer: Self-pay | Admitting: Neurosurgery

## 2015-10-05 NOTE — Progress Notes (Signed)
Subjective: Patient reports "I just have some pain at the incision...no leg pain"  Objective: Vital signs in last 24 hours: Temp:  [97.1 F (36.2 C)-99.8 F (37.7 C)] 98.8 F (37.1 C) (06/07 0400) Pulse Rate:  [63-102] 79 (06/07 0400) Resp:  [13-33] 20 (06/07 0400) BP: (119-146)/(71-97) 126/79 mmHg (06/07 0400) SpO2:  [87 %-100 %] 98 % (06/07 0400) Weight:  [115.214 kg (254 lb)] 115.214 kg (254 lb) (06/06 1019)  Intake/Output from previous day: 06/06 0701 - 06/07 0700 In: 1600 [I.V.:1600] Out: 1345 [Urine:1145; Blood:200] Intake/Output this shift:    Alert, conversant.  Reports no leg pain, only incisional discomfort. Strength full BLE. Walked short distance in hallway last night. Incision without erythema, swelling, or drainage beneath intact honeycomb & Dermabond.   Lab Results: No results for input(s): WBC, HGB, HCT, PLT in the last 72 hours. BMET No results for input(s): NA, K, CL, CO2, GLUCOSE, BUN, CREATININE, CALCIUM in the last 72 hours.  Studies/Results: Dg Lumbar Spine 2-3 Views  10/04/2015  CLINICAL DATA:  L5-S1 fixation EXAM: DG C-ARM 61-120 MIN; LUMBAR SPINE - 2-3 VIEW COMPARISON:  None. FLUOROSCOPY TIME:  Radiation Exposure Index (as provided by the fluoroscopic device): Not available If the device does not provide the exposure index: Fluoroscopy Time:  43 seconds Number of Acquired Images:  2 FINDINGS: Interbody fusion is noted at L5-S1. Pedicle screws are noted at both levels. IMPRESSION: Intraoperative localization at L5-S1 for interbody fusion. Electronically Signed   By: Alcide CleverMark  Lukens M.D.   On: 10/04/2015 15:58   Dg C-arm 1-60 Min  10/04/2015  CLINICAL DATA:  L5-S1 fixation EXAM: DG C-ARM 61-120 MIN; LUMBAR SPINE - 2-3 VIEW COMPARISON:  None. FLUOROSCOPY TIME:  Radiation Exposure Index (as provided by the fluoroscopic device): Not available If the device does not provide the exposure index: Fluoroscopy Time:  43 seconds Number of Acquired Images:  2 FINDINGS:  Interbody fusion is noted at L5-S1. Pedicle screws are noted at both levels. IMPRESSION: Intraoperative localization at L5-S1 for interbody fusion. Electronically Signed   By: Alcide CleverMark  Lukens M.D.   On: 10/04/2015 15:58    Assessment/Plan: Improving   LOS: 1 day  Mobilize in LSO with PT today.    Georgiann Cockeroteat, Mateo Overbeck 10/05/2015, 7:36 AM

## 2015-10-05 NOTE — Evaluation (Signed)
Occupational Therapy Evaluation Patient Details Name: Seth MedalKevin Falor MRN: [id number] DOB: 04/10/1961 Today's Date: 10/05/2015    History of Present Illness Pt is a 55 y.o. male who presents s/p Lumbar five-sacral one Maximum access posterior lumbar interbody fusion (N/A) with PEEK cages, autograft, allograft, pedicle screw fixation, posterolateral arthrodesis. medical history includes HTN, GERD, osteoporosis, scoliosis, arthritis, anxiety.   Clinical Impression   Pt s/p above. Pt independent with ADLs, PTA. Feel pt will benefit from acute OT to increase independence prior to d/c. Do not feel pt will need follow up OT upon d/c.     Follow Up Recommendations  No OT follow up;Supervision - Intermittent    Equipment Recommendations  3 in 1 bedside comode;Other (comment) (AE if wanted)    Recommendations for Other Services       Precautions / Restrictions Precautions Precautions: Fall;Back Precaution Booklet Issued: No Precaution Comments: reviewed back precautions Required Braces or Orthoses: Spinal Brace Spinal Brace: Lumbar corset;Applied in sitting position Restrictions Weight Bearing Restrictions: No      Mobility Bed Mobility Overal bed mobility: Needs Assistance Bed Mobility: Rolling;Sidelying to Sit Rolling: Supervision Sidelying to sit: Supervision     General bed mobility comments: cues for technique.  Transfers Overall transfer level: Needs assistance Transfers: Sit to/from Stand Sit to Stand: Min guard            Balance Min guard for ambulation and for single leg stance.                            ADL Overall ADL's : Needs assistance/impaired   Eating/Feeding Details (indicate cue type and reason): drank liquid sitting EOB-independent             Upper Body Dressing : Minimal assistance;Sitting   Lower Body Dressing: Minimal assistance;With adaptive equipment;Sit to/from stand   Toilet Transfer: Min guard;Ambulation (sit to stand  from bed)           Functional mobility during ADLs: Min guard General ADL Comments: Discussed incorporating precautions into functional activities. Educated on back brace. Educated on AE and pt practiced with reacher and sock aid. Educated on what pt could use for toilet aid if needed.     Vision     Perception     Praxis      Pertinent Vitals/Pain Pain Assessment: 0-10 Pain Score: 4  Faces Pain Scale: Hurts even more Pain Location: back and hips  Pain Descriptors / Indicators: Aching Pain Intervention(s): Limited activity within patient's tolerance;Monitored during session     Hand Dominance Right   Extremity/Trunk Assessment Upper Extremity Assessment Upper Extremity Assessment: Overall WFL for tasks assessed   Lower Extremity Assessment Lower Extremity Assessment: Defer to PT evaluation     Communication Communication Communication: No difficulties   Cognition Arousal/Alertness: Awake/alert Behavior During Therapy: WFL for tasks assessed/performed Overall Cognitive Status: Within Functional Limits for tasks assessed                     General Comments       Exercises       Shoulder Instructions      Home Living Family/patient expects to be discharged to:: Private residence Living Arrangements: Spouse/significant other Available Help at Discharge: Family;Available 24 hours/day Type of Home: House Home Access: Stairs to enter Entergy CorporationEntrance Stairs-Number of Steps: 3 Entrance Stairs-Rails: Left Home Layout: One level     Bathroom Shower/Tub: Producer, television/film/videoWalk-in shower   Bathroom Toilet:  (elevated  toilet) Bathroom Accessibility: Yes   Home Equipment: Shower seat - built in          Prior Functioning/Environment Level of Independence: Needs assistance    ADL's / Homemaking Assistance Needed: assist with yardwork        OT Diagnosis: Acute pain   OT Problem List: Decreased strength;Decreased range of motion;Decreased activity  tolerance;Decreased knowledge of use of DME or AE;Decreased knowledge of precautions;Pain   OT Treatment/Interventions: Patient/family education;Self-care/ADL training;DME and/or AE instruction;Therapeutic activities;Balance training    OT Goals(Current goals can be found in the care plan section) Acute Rehab OT Goals Patient Stated Goal: not stated OT Goal Formulation: With patient Time For Goal Achievement: 10/12/15 Potential to Achieve Goals: Good ADL Goals Pt Will Perform Grooming: with set-up;standing Pt Will Perform Lower Body Dressing: with set-up;with supervision;with adaptive equipment;with caregiver independent in assisting;sit to/from stand Pt Will Transfer to Toilet: ambulating;with modified independence Pt Will Perform Toileting - Clothing Manipulation and hygiene: with modified independence;sit to/from stand;sitting/lateral leans Additional ADL Goal #1: Pt will independently verbalize 3/3 back precautions and maintain in session.  OT Frequency: Min 2X/week   Barriers to D/C:            Co-evaluation              End of Session Equipment Utilized During Treatment: Gait belt;Back brace;Other (comment) (AE) Nurse Communication: Other (comment) (3 in 1; no OT follow up)  Activity Tolerance: Patient limited by pain Patient left: in bed;with family/visitor present;with call bell/phone within reach (sitting EOB)   Time: 1610-9604 OT Time Calculation (min): 15 min Charges:  OT General Charges $OT Visit: 1 Procedure OT Evaluation $OT Eval Moderate Complexity: 1 Procedure G-CodesEarlie Raveling OTR/L [phone] 10/05/2015, 10:55 AM

## 2015-10-05 NOTE — Anesthesia Postprocedure Evaluation (Signed)
Anesthesia Post Note  Patient: Seth Hampton  Procedure(s) Performed: Procedure(s) (LRB): Lumbar five-sacral one Maximum access posterior lumbar interbody fusion (N/A)  Patient location during evaluation: PACU Anesthesia Type: General Level of consciousness: awake and alert, oriented and patient cooperative Pain management: pain level controlled Vital Signs Assessment: post-procedure vital signs reviewed and stable Respiratory status: spontaneous breathing, nonlabored ventilation, respiratory function stable and patient connected to nasal cannula oxygen Cardiovascular status: blood pressure returned to baseline and stable Postop Assessment: no signs of nausea or vomiting Anesthetic complications: no Comments: Delayed entry, pt eval in PACU on day of surgery    Last Vitals:  Filed Vitals:   10/05/15 0400 10/05/15 0928  BP: 126/79 126/74  Pulse: 79 79  Temp: 37.1 C 36.6 C  Resp: 20 20    Last Pain:  Filed Vitals:   10/05/15 1330  PainSc: 5                  Shaquayla Klimas,E. Gevin Perea

## 2015-10-05 NOTE — Evaluation (Signed)
Physical Therapy Evaluation Patient Details Name: Seth Hampton MRN: [id number] DOB: 24-Nov-1960 Today's Date: 10/05/2015   History of Present Illness  Pt is a 55 y/o male who presents s/p L5-S1 PLIF on 10/04/15.  Clinical Impression  Pt admitted with above diagnosis. Pt currently with functional limitations due to the deficits listed below (see PT Problem List). At the time of PT eval pt was able to perform transfers and ambulation with gross min guard assist. Pt reports feeling very tense and anxious regarding mobility and not wanting to "hurt anything" regarding the surgical site. Precautions were reinforced with pt and wife. Pt will benefit from skilled PT to increase their independence and safety with mobility to allow discharge to the venue listed below.       Follow Up Recommendations Outpatient PT;Supervision for mobility/OOB    Equipment Recommendations  None recommended by PT    Recommendations for Other Services       Precautions / Restrictions Precautions Precautions: Fall;Back Precaution Booklet Issued: Yes (comment) Precaution Comments: Reviewed handout and pt was cued for precautions during functional mobility.  Required Braces or Orthoses: Spinal Brace Spinal Brace: Lumbar corset;Applied in sitting position Restrictions Weight Bearing Restrictions: No      Mobility  Bed Mobility Overal bed mobility: Needs Assistance Bed Mobility: Rolling;Sidelying to Sit;Sit to Sidelying Rolling: Supervision Sidelying to sit: Supervision     Sit to sidelying: Min assist General bed mobility comments: Assist for LE elevation back into the bed. Pt required step-by-step cues for sequencing and technique.   Transfers Overall transfer level: Needs assistance Equipment used: None Transfers: Sit to/from Stand Sit to Stand: Min assist         General transfer comment: Assist to power-up to full stand from elevated bed. Pt was not able to achieve full stand from bed in lowest  position.   Ambulation/Gait Ambulation/Gait assistance: Min guard Ambulation Distance (Feet): 300 Feet Assistive device: None Gait Pattern/deviations: Step-through pattern;Decreased stride length;Trunk flexed Gait velocity: Decreased Gait velocity interpretation: Below normal speed for age/gender General Gait Details: Pt slow and guarded with movement. Pt reports increased pain in hips during mobility which he states loosens up with distance. Pt became very nauseated and hot during gait training and required frequent rest breaks to avoid vomiting.   Stairs Stairs: Yes Stairs assistance: Supervision Stair Management: One rail Left;Step to pattern;Forwards Number of Stairs: 4 General stair comments: VC's for sequencing and general safety awareness.   Wheelchair Mobility    Modified Rankin (Stroke Patients Only)       Balance Overall balance assessment: Needs assistance Sitting-balance support: Feet supported;No upper extremity supported Sitting balance-Leahy Scale: Fair     Standing balance support: No upper extremity supported;During functional activity Standing balance-Leahy Scale: Fair                               Pertinent Vitals/Pain Pain Assessment: Faces Faces Pain Scale: Hurts even more Pain Location: Hips, incision site Pain Descriptors / Indicators: Operative site guarding;Sore Pain Intervention(s): Limited activity within patient's tolerance;Monitored during session;Repositioned    Home Living Family/patient expects to be discharged to:: Private residence Living Arrangements: Spouse/significant other Available Help at Discharge: Family;Available 24 hours/day Type of Home: House Home Access: Stairs to enter Entrance Stairs-Rails: Left Entrance Stairs-Number of Steps: 3 Home Layout: One level Home Equipment: Shower seat - built in      Prior Function Level of Independence: Independent  Hand Dominance   Dominant Hand:  Right    Extremity/Trunk Assessment   Upper Extremity Assessment: Defer to OT evaluation           Lower Extremity Assessment: Generalized weakness      Cervical / Trunk Assessment: Other exceptions  Communication   Communication: No difficulties  Cognition Arousal/Alertness: Awake/alert Behavior During Therapy: WFL for tasks assessed/performed Overall Cognitive Status: Within Functional Limits for tasks assessed                      General Comments      Exercises        Assessment/Plan    PT Assessment Patient needs continued PT services  PT Diagnosis Difficulty walking;Acute pain   PT Problem List Decreased strength;Decreased range of motion;Decreased activity tolerance;Decreased balance;Decreased mobility;Decreased knowledge of use of DME;Decreased safety awareness;Decreased knowledge of precautions;Pain  PT Treatment Interventions DME instruction;Gait training;Stair training;Functional mobility training;Therapeutic activities;Therapeutic exercise;Neuromuscular re-education;Patient/family education   PT Goals (Current goals can be found in the Care Plan section) Acute Rehab PT Goals Patient Stated Goal: Home tomorrow PT Goal Formulation: With patient/family Time For Goal Achievement: 10/12/15 Potential to Achieve Goals: Good    Frequency Min 5X/week   Barriers to discharge        Co-evaluation               End of Session Equipment Utilized During Treatment: Back brace Activity Tolerance: Patient tolerated treatment well (Nauseated) Patient left: in bed;with call bell/phone within reach;with family/visitor present Nurse Communication: Mobility status         Time: 1610-96040816-0850 PT Time Calculation (min) (ACUTE ONLY): 34 min   Charges:   PT Evaluation $PT Eval Moderate Complexity: 1 Procedure PT Treatments $Gait Training: 8-22 mins   PT G Codes:        Conni SlipperKirkman, Billiejean Schimek 10/05/2015, 9:16 AM   Conni SlipperLaura Patty Leitzke, PT, DPT Acute  Rehabilitation Services Pager: [phone]

## 2015-10-06 NOTE — Progress Notes (Signed)
Physical Therapy Treatment Patient Details Name: Seth Hampton MRN: [id number] DOB: 06/11/1960 Today's Date: 10/06/2015    History of Present Illness Pt is a 55 y/o male who presents s/p L5-S1 PLIF on 10/04/15.    PT Comments    Pt progressing towards physical therapy goals. Was able to perform transfers and ambulation with modified independence to supervision for safety. Pt reports continued pain but feels he is overall improving. Will continue to follow and progress as able per POC.   Follow Up Recommendations  Outpatient PT;Supervision for mobility/OOB     Equipment Recommendations  None recommended by PT    Recommendations for Other Services       Precautions / Restrictions Precautions Precautions: Fall;Back Precaution Booklet Issued: Yes (comment) Precaution Comments: Reviewed handout and pt was cued for precautions during functional mobility.  Required Braces or Orthoses: Spinal Brace Spinal Brace: Lumbar corset;Applied in sitting position Restrictions Weight Bearing Restrictions: No    Mobility  Bed Mobility Overal bed mobility: Modified Independent Bed Mobility: Rolling;Sidelying to Sit           General bed mobility comments: No assist required. HOB flat with rails lowered.   Transfers Overall transfer level: Needs assistance Equipment used: None Transfers: Sit to/from Stand Sit to Stand: Supervision         General transfer comment: Supervision to power-up to full stand from slightly elevated bed height.  Ambulation/Gait Ambulation/Gait assistance: Modified independent (Device/Increase time) Ambulation Distance (Feet): 400 Feet Assistive device: None Gait Pattern/deviations: Step-through pattern;Decreased stride length;Trunk flexed Gait velocity: Decreased Gait velocity interpretation: Below normal speed for age/gender General Gait Details: Moving better today overall. Pt was cued to attempt walking wtihout UE support on the wall and was able to  accommodate well. RW not needed however a cane may be helpful when pt begins community ambulation.   Stairs            Wheelchair Mobility    Modified Rankin (Stroke Patients Only)       Balance Overall balance assessment: Needs assistance Sitting-balance support: Feet supported;No upper extremity supported Sitting balance-Leahy Scale: Good     Standing balance support: No upper extremity supported;During functional activity Standing balance-Leahy Scale: Fair Standing balance comment: Pt reaching for furniture/railings during dynamic balance tasks                    Cognition Arousal/Alertness: Awake/alert Behavior During Therapy: WFL for tasks assessed/performed Overall Cognitive Status: Within Functional Limits for tasks assessed                      Exercises      General Comments        Pertinent Vitals/Pain Pain Assessment: Faces Pain Score: 3  Faces Pain Scale: Hurts even more Pain Location: back, hips Pain Descriptors / Indicators: Aching Pain Intervention(s): Limited activity within patient's tolerance;Monitored during session;Repositioned    Home Living                      Prior Function            PT Goals (current goals can now be found in the care plan section) Acute Rehab PT Goals Patient Stated Goal: Home tomorrow PT Goal Formulation: With patient/family Time For Goal Achievement: 10/12/15 Potential to Achieve Goals: Good Progress towards PT goals: Progressing toward goals    Frequency  Min 5X/week    PT Plan Current plan remains appropriate    Co-evaluation  End of Session Equipment Utilized During Treatment: Back brace Activity Tolerance: Patient tolerated treatment well (Nauseated) Patient left: in bed;with call bell/phone within reach;with family/visitor present     Time: 9604-5409 PT Time Calculation (min) (ACUTE ONLY): 20 min  Charges:  $Gait Training: 8-22 mins                     G Codes:      Conni Slipper 10/17/15, 11:58 AM  Conni Slipper, PT, DPT Acute Rehabilitation Services Pager: [phone]

## 2015-10-06 NOTE — Progress Notes (Signed)
Occupational Therapy Treatment/Discharge Patient Details Name: Seth Hampton MRN: [id number] DOB: Aug 22, 1960 Today's Date: 10/06/2015    History of present illness Pt is a 55 y.o. male who presents s/p Lumbar five-sacral one Maximum access posterior lumbar interbody fusion (N/A) with PEEK cages, autograft, allograft, pedicle screw fixation, posterolateral arthrodesis. medical history includes HTN, GERD, osteoporosis, scoliosis, arthritis, anxiety.   OT comments  Pt progressing well with ADLs and mobility, but still reports concerns of nausea and BLE weakness. Pt completed all LB ADLs and transfers at supervision level with compensatory strategies and AE as needed. Reviewed back precautions, brace wear protocol, positioning for sleep, and compensatory strategies for ADLs. Educated pt and his wife on energy conservation, gradual activity progress, and fall prevention strategies. All education has been completed and pt has no further questions. Pt with no further acute OT needs. OT signing off.  Follow Up Recommendations  No OT follow up;Supervision - Intermittent    Equipment Recommendations  3 in 1 bedside comode    Recommendations for Other Services      Precautions / Restrictions Precautions Precautions: Fall;Back Precaution Booklet Issued: No Precaution Comments: Pt able to recall 3/3 back precautions at beginning of session. Required Braces or Orthoses: Spinal Brace Spinal Brace: Lumbar corset;Applied in sitting position Restrictions Weight Bearing Restrictions: No       Mobility Bed Mobility               General bed mobility comments: Pt ambulating in hallway on OT arrival  Transfers Overall transfer level: Needs assistance Equipment used: None Transfers: Sit to/from Stand Sit to Stand: Supervision         General transfer comment: Supervision for safety. Pt reaching out of BoS for furniture/railings while ambulating in room and hallway. Encouraged pt to use RW at  home for first several days to decrease fall risk.    Balance Overall balance assessment: Needs assistance Sitting-balance support: No upper extremity supported;Feet supported Sitting balance-Leahy Scale: Good     Standing balance support: No upper extremity supported;During functional activity Standing balance-Leahy Scale: Fair Standing balance comment: Pt reaching for furniture/railings during dynamic balance tasks                   ADL Overall ADL's : Needs assistance/impaired     Grooming: Wash/dry hands;Wash/dry face;Supervision/safety;Standing   Upper Body Bathing: Supervision/ safety;Sitting   Lower Body Bathing: Supervison/ safety;Sit to/from stand;Cueing for compensatory techniques;With adaptive equipment   Upper Body Dressing : Supervision/safety;Sitting   Lower Body Dressing: Supervision/safety;Adhering to back precautions;Sit to/from stand;With adaptive equipment   Toilet Transfer: Supervision/safety;Ambulation;BSC;RW   Toileting- Clothing Manipulation and Hygiene: Supervision/safety;Sit to/from stand   Tub/ Shower Transfer: Walk-in shower;Supervision/safety;Cueing for safety;Ambulation Tub/Shower Transfer Details (indicate cue type and reason): advised pt on use of 3in1 as shower seat if needed Functional mobility during ADLs: Supervision/safety General ADL Comments: Reviewed back precautions, brace wear protocol, use of AE, and compensatory strategies for ADLs. Educated pt/wife on energy conservation, gradual activity progression, and fall prevention strategies.      Vision                     Perception     Praxis      Cognition   Behavior During Therapy: [facility] for tasks assessed/performed Overall Cognitive Status: Within Functional Limits for tasks assessed                       Extremity/Trunk Assessment  Exercises     Shoulder Instructions       General Comments      Pertinent Vitals/ Pain        Pain Assessment: 0-10 Pain Score: 3  Pain Location: back and hips Pain Descriptors / Indicators: Aching Pain Intervention(s): Limited activity within patient's tolerance;Monitored during session;Premedicated before session;Repositioned  Home Living                                          Prior Functioning/Environment              Frequency       Progress Toward Goals  OT Goals(current goals can now be found in the care plan section)  Progress towards OT goals: Goals met/education completed, patient discharged from OT  Acute Rehab OT Goals Patient Stated Goal: home when ready OT Goal Formulation: With patient Time For Goal Achievement: 10/12/15 Potential to Achieve Goals: Good ADL Goals Pt Will Perform Grooming: with set-up;standing Pt Will Perform Lower Body Dressing: with set-up;with supervision;with adaptive equipment;with caregiver independent in assisting;sit to/from stand Pt Will Transfer to Toilet: ambulating;with modified independence Pt Will Perform Toileting - Clothing Manipulation and hygiene: with modified independence;sit to/from stand;sitting/lateral leans Additional ADL Goal #1: Pt will independently verbalize 3/3 back precautions and maintain in session.  Plan All goals met and education completed, patient discharged from OT services    Co-evaluation                 End of Session Equipment Utilized During Treatment: Gait belt;Rolling walker;Back brace   Activity Tolerance Patient tolerated treatment well   Patient Left in chair;with call bell/phone within reach;with family/visitor present   Nurse Communication Mobility status        Time: 5602-7829 OT Time Calculation (min): 17 min  Charges: OT General Charges $OT Visit: 1 Procedure OT Treatments $Self Care/Home Management : 8-22 mins  Redmond Baseman, OTR/L Pager: [phone] 10/06/2015, 9:24 AM

## 2015-10-06 NOTE — Discharge Instructions (Signed)

## 2015-10-06 NOTE — Discharge Summary (Signed)
Physician Discharge Summary  Patient ID: Seth Hampton MRN: [id number] DOB/AGE: 54/11/1960 54 y.o.  Admit date: 10/04/2015 Discharge date: 10/06/2015  Admission Diagnoses: Spondylolisthesis, Lumbosacral region L 5 S 1, herniated disc, spondylosis, stenosis, radiculopathy   Discharge Diagnoses: Spondylolisthesis, Lumbosacral region L 5 S 1, herniated disc, spondylosis, stenosis, radiculopathy s/p Lumbar five-sacral one Maximum access posterior lumbar interbody fusion (N/A) with PEEK cages, autograft, allograft, pedicle screw fixation, posterolateral arthrodesis  Active Problems:   Spondylolisthesis of lumbar region   Discharged Condition: good  Hospital Course: Seth Hampton was admitted for surgery with dx spondylolisthesis, stenosis, radiculopathy. Following uncomplicated decompression and fusion L5-S1, he recovered nicely and transferred to [id number] for nursing care and therapies. He is mobilizing well.   Consults: None  Significant Diagnostic Studies: radiology: X-Ray: intra-op  Treatments: surgery: Lumbar five-sacral one Maximum access posterior lumbar interbody fusion (N/A) with PEEK cages, autograft, allograft, pedicle screw fixation, posterolateral arthrodesis   Discharge Exam: Blood pressure 134/87, pulse 83, temperature 99.4 F (37.4 C), temperature source Oral, resp. rate 18, height 6' (1.829 m), weight 115.214 kg (254 lb), SpO2 96 %. Alert, conversant, wife present. Pt reports decreased lumbar pain today, but soreness both thighs. Anxious re: T100 last night - reassured. Incision without erythema, swelling, or drainage beneath honeycomb & Dermabond. Good strength BLE. No BM yet, but he wants to work on this at home. No discomfort or distention.   Disposition: Discharge to home. Elevated 3-in-1 commode seat.  Rx's Percocet & Tizanidine for prn home use. Pt verbalizes understanding of d/c instructions.  He already has f/u appt scheduled.       Medication List    ASK your doctor  about these medications        omeprazole 20 MG capsule  Commonly known as:  PRILOSEC  Take 20 mg by mouth daily.     oxyCODONE-acetaminophen 5-325 MG tablet  Commonly known as:  PERCOCET/ROXICET  Take 1 tablet by mouth every 6 (six) hours as needed for moderate pain or severe pain.     tiZANidine 2 MG tablet  Commonly known as:  ZANAFLEX  Take 2 mg by mouth every 6 (six) hours as needed for muscle spasms.         Signed: Georgiann Cockeroteat, Blimy Napoleon 10/06/2015, 9:42 AM

## 2015-10-06 NOTE — Progress Notes (Signed)
Subjective: Patient reports "I'm pretty sore in my legs today, but I walked this morning"  Objective: Vital signs in last 24 hours: Temp:  [98.1 F (36.7 C)-100.3 F (37.9 C)] 99.4 F (37.4 C) (06/08 0834) Pulse Rate:  [56-101] 83 (06/08 0834) Resp:  [18-22] 18 (06/08 0834) BP: (134-139)/(81-87) 134/87 mmHg (06/08 0834) SpO2:  [94 %-98 %] 96 % (06/08 0834)  Intake/Output from previous day: 06/07 0701 - 06/08 0700 In: 240 [P.O.:240] Out: -  Intake/Output this shift: Total I/O In: 360 [P.O.:360] Out: -   Alert, conversant, wife present. Pt reports decreased lumbar pain today, but soreness both thighs. Anxious re: T100 last night - reassured. Incision without erythema, swelling, or drainage beneath honeycomb & Dermabond. Good strength BLE. No BM yet, but he wants to work on this at home. No discomfort or distention.  Lab Results: No results for input(s): WBC, HGB, HCT, PLT in the last 72 hours. BMET No results for input(s): NA, K, CL, CO2, GLUCOSE, BUN, CREATININE, CALCIUM in the last 72 hours.  Studies/Results: Dg Lumbar Spine 2-3 Views  10/04/2015  CLINICAL DATA:  L5-S1 fixation EXAM: DG C-ARM 61-120 MIN; LUMBAR SPINE - 2-3 VIEW COMPARISON:  None. FLUOROSCOPY TIME:  Radiation Exposure Index (as provided by the fluoroscopic device): Not available If the device does not provide the exposure index: Fluoroscopy Time:  43 seconds Number of Acquired Images:  2 FINDINGS: Interbody fusion is noted at L5-S1. Pedicle screws are noted at both levels. IMPRESSION: Intraoperative localization at L5-S1 for interbody fusion. Electronically Signed   By: Alcide CleverMark  Lukens M.D.   On: 10/04/2015 15:58   Dg C-arm 1-60 Min  10/04/2015  CLINICAL DATA:  L5-S1 fixation EXAM: DG C-ARM 61-120 MIN; LUMBAR SPINE - 2-3 VIEW COMPARISON:  None. FLUOROSCOPY TIME:  Radiation Exposure Index (as provided by the fluoroscopic device): Not available If the device does not provide the exposure index: Fluoroscopy Time:  43  seconds Number of Acquired Images:  2 FINDINGS: Interbody fusion is noted at L5-S1. Pedicle screws are noted at both levels. IMPRESSION: Intraoperative localization at L5-S1 for interbody fusion. Electronically Signed   By: Alcide CleverMark  Lukens M.D.   On: 10/04/2015 15:58    Assessment/Plan: Improving   LOS: 2 days  Per DrStern, d/c IV, d/c to home. Rx's Percocet & Tizanidine for prn home use. Pt verbalizes understanding of d/c instructions.  he already has f/u appt scheduled.    Georgiann Cockeroteat, Betina Puckett 10/06/2015, 9:36 AM

## 2015-10-06 NOTE — Progress Notes (Signed)
Pt and family given D/C instructions with Rx's, verbal understanding was provided. Pt's incision is open to air and is clean and dry with no sign of infection. Pt's IV was removed prior to D/C. Marland Kitchen. Pt is stable.

## 2016-08-16 ENCOUNTER — Other Ambulatory Visit: Payer: Self-pay | Admitting: Neurosurgery

## 2016-08-20 NOTE — H&P (Signed)
Patient ID:   [id number] Patient: Seth Hampton  Date of Birth: 01-31-1961 Visit Type: Office Visit   Date: 08/15/2016 01:30 PM Provider: Danae Orleans. Venetia Maxon MD   This 56 year old male presents for back pain.  History of Present Illness: 1.  back pain    Reports 24 days of relief from most recent injection.  Patient's BMI is 34.  MRI 08/09/2016: postsurgical changes within the lower lumbar spine. There is no evidence of abnormal osseous or soft tissue enhancement. There is no evidence of thecal sac stenosis and neuroforaminal narrowing.  I reviewed the imaging with the patient and his wife and explained how he has developed adjacent segment stenosis and nerve root compression, right greater than left, at the L 45 level.  This appears to be entirely consistent with his worsening complaints.      Medical/Surgical/Interim History Reviewed, no change.  Last detailed document date:06/27/2015.   PAST MEDICAL HISTORY, SURGICAL HISTORY, FAMILY HISTORY, SOCIAL HISTORY AND REVIEW OF SYSTEMS I have reviewed the patient's past medical, surgical, family and social history as well as the comprehensive review of systems as included on the [facility] history form dated 06/27/2015, which I have signed.  Family History: Reviewed, no changes.  Last detailed document: 06/27/2015.   Social History: Tobacco use reviewed. Reviewed, no changes. Last detailed document date: 06/27/2015.      MEDICATIONS(added, continued or stopped this visit): Started Medication Directions Instruction Stopped  05/21/2016 amoxicillin 500 mg tablet take 4 tabs prior to dental procedure  08/15/2016  07/19/2016 IBUPROFEN 800 MG TABLET TAKE 1 TABLET BY MOUTH UP TO 3 TIMES A DAY AS NEEDED FOR PAIN     omeprazole 20 mg capsule,delayed release take 1 capsule by oral route  every day 30 minutes to 1 hour before a meal    08/08/2016 Valium 10 mg tablet take 1 prior to MRI        ALLERGIES: Ingredient Reaction Medication Name Comment  NO KNOWN ALLERGIES     No known allergies. Reviewed, no changes.    Vitals Date Temp F BP Pulse Ht In Wt Lb BMI BSA Pain Score  08/15/2016  147/94 69 72 247.6 33.58  9/10      IMPRESSION MRI reveals L4-5 foraminal and extraforaminal nerve compression and disc degeneration. Hardware at L5-S1 well positioned. Need radiologist to reread MRI. I recommend MAS PLIF at L4-5 and explore L5-S1 fusion.  discussed the importance of weight loss. Scheduled surgery and provided patient with handicap parking pass.  Completed Orders (this encounter) Order Details Reason Side Interpretation Result Initial Treatment Date Region  Hypertension education Continue to monitor blood pressure. If blood pressure remains elevated contact primary care provider        Dietary management education, guidance, and counseling patient encouraged to eat a well balanced diet         Assessment/Plan # Detail Type Description   1. Assessment Degenerative lumbar spinal stenosis (M48.061).       2. Assessment Low back pain, unspecified back pain laterality, with sciatica presence unspecified (M54.5).       3. 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       4. Assessment Lumbar radiculopathy (M54.16).       5. Assessment Elevated blood-pressure reading, w/o diagnosis of htn (R03.0).       6. Assessment Body mass index (BMI) 33.0-33.9, adult (H84.69).   Plan Orders Today's instructions / counseling include(s) Dietary management education, guidance, and counseling.  Pain Assessment/Treatment Pain Scale: 9/10. Method: Numeric Pain Intensity Scale. Location: back and right leg. Onset: 02/27/2015. Duration: varies. Quality: discomforting. Pain Assessment/Treatment follow-up plan of care: Patient is taking OTC pain relievers for relief..  Fall Risk Plan The patient has not fallen in the last year.  nurse education given. given  handicap parking pass. Schedule MAS PLIF at L4-5 and explore L5-S1 fusion.   Orders: Diagnostic Procedures: Assessment Procedure  M54.16 Lumbar Spine- AP/Lat  Instruction(s)/Education: Assessment Instruction  R03.0 Hypertension education  [phone] Dietary management education, guidance, and counseling             Provider:  Danae Orleans. Venetia Maxon MD  08/15/2016 05:07 PM Dictation edited by: Jorje Guild    CC Providers: Salvatore Decent [address] [place], [place] [address]              Electronically signed by Danae Orleans. Venetia Maxon MD on 08/18/2016 12:54 PM

## 2016-08-28 ENCOUNTER — Encounter (HOSPITAL_COMMUNITY)
Admission: RE | Admit: 2016-08-28 | Discharge: 2016-08-28 | Disposition: A | Discharge status: Home / Self Care | Payer: BLUE CROSS/BLUE SHIELD | Source: Ambulatory Visit | Attending: Neurosurgery | Admitting: Neurosurgery

## 2016-08-28 ENCOUNTER — Encounter (HOSPITAL_COMMUNITY): Payer: Self-pay

## 2016-08-28 DIAGNOSIS — M545 Low back pain: Secondary | ICD-10-CM | POA: Diagnosis not present

## 2016-08-28 DIAGNOSIS — R030 Elevated blood-pressure reading, without diagnosis of hypertension: Secondary | ICD-10-CM | POA: Insufficient documentation

## 2016-08-28 DIAGNOSIS — Z01812 Encounter for preprocedural laboratory examination: Secondary | ICD-10-CM | POA: Diagnosis not present

## 2016-08-28 DIAGNOSIS — Z6833 Body mass index (BMI) 33.0-33.9, adult: Secondary | ICD-10-CM | POA: Insufficient documentation

## 2016-08-28 DIAGNOSIS — M9983 Other biomechanical lesions of lumbar region: Secondary | ICD-10-CM | POA: Insufficient documentation

## 2016-08-28 DIAGNOSIS — M48061 Spinal stenosis, lumbar region without neurogenic claudication: Secondary | ICD-10-CM | POA: Insufficient documentation

## 2016-08-28 DIAGNOSIS — M5416 Radiculopathy, lumbar region: Secondary | ICD-10-CM | POA: Insufficient documentation

## 2016-08-28 DIAGNOSIS — Z9889 Other specified postprocedural states: Secondary | ICD-10-CM | POA: Diagnosis not present

## 2016-08-28 LAB — BASIC METABOLIC PANEL
Anion gap: 9 (ref 5–15)
BUN: 13 mg/dL (ref 6–20)
CALCIUM: 9.5 mg/dL (ref 8.9–10.3)
CO2: 22 mmol/L (ref 22–32)
CREATININE: 1.01 mg/dL (ref 0.61–1.24)
Chloride: 107 mmol/L (ref 101–111)
GFR calc non Af Amer: >60 mL/min (ref >60)
Glucose, Bld: 99 mg/dL (ref 65–99)
Potassium: 3.9 mmol/L (ref 3.5–5.1)
Sodium: 138 mmol/L (ref 135–145)

## 2016-08-28 LAB — CBC
HCT: 43.4 % (ref 39.0–52.0)
Hemoglobin: 15.1 g/dL (ref 13.0–17.0)
MCH: 30.8 pg (ref 26.0–34.0)
MCHC: 34.8 g/dL (ref 30.0–36.0)
MCV: 88.6 fL (ref 78.0–100.0)
PLATELETS: 156 10*3/uL (ref 150–400)
RBC: 4.9 MIL/uL (ref 4.22–5.81)
RDW: 13.4 % (ref 11.5–15.5)
WBC: 6.1 10*3/uL (ref 4.0–10.5)

## 2016-08-28 LAB — SURGICAL PCR SCREEN
MRSA, PCR: NEGATIVE
Staphylococcus aureus: NEGATIVE

## 2016-08-28 LAB — TYPE AND SCREEN
ABO/RH(D): O POS
ANTIBODY SCREEN: NEGATIVE

## 2016-08-28 MED ORDER — CHLORHEXIDINE GLUCONATE CLOTH 2 % EX PADS: 6.0000 | MEDICATED_PAD | Freq: Once | CUTANEOUS | Status: DC

## 2016-08-28 NOTE — Progress Notes (Addendum)
PCP: Dr. Salvatore Decent  Cardiologist:pt denies  EKG: 08/2015 in EPIC  Stress test: pt denies in the past 10-15 years  ECHO: pt denies ever  Cardiac Cath: pt denies ever  Chest x-ray: pt denies past year

## 2016-08-28 NOTE — Pre-Procedure Instructions (Signed)
Seth Hampton  08/28/2016      [id number] [place], [place] - [address] [place] [address] Phone: [phone] Fax: [phone]    Your procedure is scheduled on May 8 at 730 AM.  Report to [facility] Admitting at 530 AM.  Call this number if you have problems the morning of surgery:[phone]   Remember:  Do not eat food or drink liquids after midnight.  Take these medicines the morning of surgery with A SIP OF WATER fluticasone (flonase) nasal spray, omeprazole (prilosec), oxycodone (percocet)-if needed for pain, tizanidine (zanaflex)- if needed for muscle spasms.  7 days prior to surgery STOP taking any Aspirin, Aleve, Naproxen, Ibuprofen, Motrin, Advil, Goody's, BC's, all herbal medications, fish oil, and all vitamins   Do not wear jewelry, make-up or nail polish.  Do not wear lotions, powders, or perfumes, or deoderant.  Men may shave face and neck.  Do not bring valuables to the hospital.  [facility] is not responsible for any belongings or valuables.  Contacts, dentures or bridgework may not be worn into surgery.  Leave your suitcase in the car.  After surgery it may be brought to your room.  For patients admitted to the hospital, discharge time will be determined by your treatment team.  Patients discharged the day of surgery will not be allowed to drive home.   Special instructions:   Minor- Preparing For Surgery  Before surgery, you can play an important role. Because skin is not sterile, your skin needs to be as free of germs as possible. You can reduce the number of germs on your skin by washing with CHG (chlorahexidine gluconate) Soap before surgery.  CHG is an antiseptic cleaner which kills germs and bonds with the skin to continue killing germs even after washing.  Please do not use if you have an allergy to CHG or antibacterial soaps. If your skin becomes reddened/irritated stop using the CHG.  Do not  shave (including legs and underarms) for at least 48 hours prior to first CHG shower. It is OK to shave your face.  Please follow these instructions carefully.   1. Shower the NIGHT BEFORE SURGERY and the MORNING OF SURGERY with CHG.   2. If you chose to wash your hair, wash your hair first as usual with your normal shampoo.  3. After you shampoo, rinse your hair and body thoroughly to remove the shampoo.  4. Use CHG as you would any other liquid soap. You can apply CHG directly to the skin and wash gently with a scrungie or a clean washcloth.   5. Apply the CHG Soap to your body ONLY FROM THE NECK DOWN.  Do not use on open wounds or open sores. Avoid contact with your eyes, ears, mouth and genitals (private parts). Wash genitals (private parts) with your normal soap.  6. Wash thoroughly, paying special attention to the area where your surgery will be performed.  7. Thoroughly rinse your body with warm water from the neck down.  8. DO NOT shower/wash with your normal soap after using and rinsing off the CHG Soap.  9. Pat yourself dry with a CLEAN TOWEL.   10. Wear CLEAN PAJAMAS   11. Place CLEAN SHEETS on your bed the night of your first shower and DO NOT SLEEP WITH PETS.    Day of Surgery: Do not apply any deodorants/lotions. Please wear clean clothes to the hospital/surgery center.  Please read over the following fact sheets that you were given. Pain Booklet, Coughing and Deep Breathing, MRSA Information and Surgical Site Infection Prevention

## 2016-09-04 ENCOUNTER — Inpatient Hospital Stay (HOSPITAL_COMMUNITY): Payer: BLUE CROSS/BLUE SHIELD | Admitting: Anesthesiology

## 2016-09-04 ENCOUNTER — Inpatient Hospital Stay (HOSPITAL_COMMUNITY): Payer: BLUE CROSS/BLUE SHIELD

## 2016-09-04 ENCOUNTER — Inpatient Hospital Stay (HOSPITAL_COMMUNITY)
Admission: RE | Admit: 2016-09-04 | Discharge: 2016-09-05 | DRG: 460 | Disposition: A | Discharge status: Home / Self Care | Payer: BLUE CROSS/BLUE SHIELD | Source: Ambulatory Visit | Attending: Neurosurgery | Admitting: Neurosurgery

## 2016-09-04 ENCOUNTER — Encounter (HOSPITAL_COMMUNITY): Payer: Self-pay | Admitting: Urology

## 2016-09-04 ENCOUNTER — Encounter (HOSPITAL_COMMUNITY)
Admission: RE | Disposition: A | Discharge status: Home / Self Care | Payer: Self-pay | Source: Ambulatory Visit | Attending: Neurosurgery

## 2016-09-04 DIAGNOSIS — Z7951 Long term (current) use of inhaled steroids: Secondary | ICD-10-CM | POA: Diagnosis not present

## 2016-09-04 DIAGNOSIS — M1990 Unspecified osteoarthritis, unspecified site: Secondary | ICD-10-CM | POA: Diagnosis present

## 2016-09-04 DIAGNOSIS — M48061 Spinal stenosis, lumbar region without neurogenic claudication: Secondary | ICD-10-CM | POA: Diagnosis present

## 2016-09-04 DIAGNOSIS — M5116 Intervertebral disc disorders with radiculopathy, lumbar region: Secondary | ICD-10-CM | POA: Diagnosis present

## 2016-09-04 DIAGNOSIS — G4730 Sleep apnea, unspecified: Secondary | ICD-10-CM | POA: Diagnosis present

## 2016-09-04 DIAGNOSIS — M549 Dorsalgia, unspecified: Secondary | ICD-10-CM | POA: Diagnosis present

## 2016-09-04 DIAGNOSIS — Z419 Encounter for procedure for purposes other than remedying health state, unspecified: Secondary | ICD-10-CM

## 2016-09-04 DIAGNOSIS — K219 Gastro-esophageal reflux disease without esophagitis: Secondary | ICD-10-CM | POA: Diagnosis present

## 2016-09-04 DIAGNOSIS — I10 Essential (primary) hypertension: Secondary | ICD-10-CM | POA: Diagnosis present

## 2016-09-04 HISTORY — PX: MAXIMUM ACCESS (MAS)POSTERIOR LUMBAR INTERBODY FUSION (PLIF) 1 LEVEL: SHX6368

## 2016-09-04 SURGERY — FOR MAXIMUM ACCESS (MAS) POSTERIOR LUMBAR INTERBODY FUSION (PLIF) 1 LEVEL
Anesthesia: General | Site: Spine Lumbar

## 2016-09-04 MED ORDER — KETAMINE HCL 100 MG/ML IJ SOLN
INTRAMUSCULAR | Status: AC
  Filled 2016-09-04: qty 1

## 2016-09-04 MED ORDER — OXYCODONE-ACETAMINOPHEN 5-325 MG PO TABS: 1.0000 | ORAL_TABLET | Freq: Four times a day (QID) | ORAL | Status: DC | PRN

## 2016-09-04 MED ORDER — PHENOL 1.4 % MT LIQD: 1.0000 | OROMUCOSAL | Status: DC | PRN

## 2016-09-04 MED ORDER — FENTANYL CITRATE (PF) 100 MCG/2ML IJ SOLN
INTRAMUSCULAR | Status: AC
  Filled 2016-09-04: qty 2

## 2016-09-04 MED ORDER — KCL IN DEXTROSE-NACL 20-5-0.45 MEQ/L-%-% IV SOLN: INTRAVENOUS | Status: DC

## 2016-09-04 MED ORDER — FENTANYL CITRATE (PF) 250 MCG/5ML IJ SOLN
INTRAMUSCULAR | Status: AC
  Filled 2016-09-04: qty 5

## 2016-09-04 MED ORDER — TIZANIDINE HCL 4 MG PO TABS: 2.0000 mg | ORAL_TABLET | Freq: Four times a day (QID) | ORAL | Status: DC | PRN

## 2016-09-04 MED ORDER — ONDANSETRON HCL 4 MG/2ML IJ SOLN
INTRAMUSCULAR | Status: AC
  Filled 2016-09-04: qty 2

## 2016-09-04 MED ORDER — METHOCARBAMOL 500 MG PO TABS
500.0000 mg | ORAL_TABLET | Freq: Four times a day (QID) | ORAL | Status: DC | PRN
  Administered 2016-09-04 – 2016-09-05 (×4): 500 mg via ORAL
  Filled 2016-09-04 (×4): qty 1

## 2016-09-04 MED ORDER — CEFAZOLIN SODIUM-DEXTROSE 2-4 GM/100ML-% IV SOLN
2.0000 g | Freq: Three times a day (TID) | INTRAVENOUS | Status: AC
  Administered 2016-09-04 (×2): 2 g via INTRAVENOUS
  Filled 2016-09-04 (×2): qty 100

## 2016-09-04 MED ORDER — FENTANYL CITRATE (PF) 100 MCG/2ML IJ SOLN
25.0000 ug | INTRAMUSCULAR | Status: DC | PRN
  Administered 2016-09-04 (×2): 50 ug via INTRAVENOUS

## 2016-09-04 MED ORDER — KETAMINE HCL 10 MG/ML IJ SOLN
INTRAMUSCULAR | Status: DC | PRN
  Administered 2016-09-04: 54.05 mg via INTRAVENOUS

## 2016-09-04 MED ORDER — THROMBIN 5000 UNITS EX SOLR
CUTANEOUS | Status: DC | PRN
  Administered 2016-09-04: 5 mL via TOPICAL

## 2016-09-04 MED ORDER — DEXAMETHASONE SODIUM PHOSPHATE 10 MG/ML IJ SOLN
INTRAMUSCULAR | Status: AC
  Filled 2016-09-04: qty 1

## 2016-09-04 MED ORDER — THROMBIN 20000 UNITS EX SOLR
CUTANEOUS | Status: AC
  Filled 2016-09-04: qty 20000

## 2016-09-04 MED ORDER — FLEET ENEMA 7-19 GM/118ML RE ENEM: 1.0000 | ENEMA | Freq: Once | RECTAL | Status: DC | PRN

## 2016-09-04 MED ORDER — BISACODYL 10 MG RE SUPP: 10.0000 mg | Freq: Every day | RECTAL | Status: DC | PRN

## 2016-09-04 MED ORDER — HYDROCODONE-ACETAMINOPHEN 5-325 MG PO TABS
1.0000 | ORAL_TABLET | ORAL | Status: DC | PRN
  Administered 2016-09-04 – 2016-09-05 (×5): 2 via ORAL
  Filled 2016-09-04 (×5): qty 2

## 2016-09-04 MED ORDER — SODIUM CHLORIDE 0.9% FLUSH: 3.0000 mL | Freq: Two times a day (BID) | INTRAVENOUS | Status: DC

## 2016-09-04 MED ORDER — LACTATED RINGERS IV SOLN
INTRAVENOUS | Status: DC | PRN
  Administered 2016-09-04 (×2): via INTRAVENOUS

## 2016-09-04 MED ORDER — LIDOCAINE 2% (20 MG/ML) 5 ML SYRINGE
INTRAMUSCULAR | Status: AC
  Filled 2016-09-04: qty 5

## 2016-09-04 MED ORDER — DOCUSATE SODIUM 100 MG PO CAPS
100.0000 mg | ORAL_CAPSULE | Freq: Two times a day (BID) | ORAL | Status: DC
  Administered 2016-09-04 – 2016-09-05 (×3): 100 mg via ORAL
  Filled 2016-09-04 (×3): qty 1

## 2016-09-04 MED ORDER — SODIUM CHLORIDE 0.9 % IV SOLN
INTRAVENOUS | Status: DC | PRN
  Administered 2016-09-04: 10 ug/kg/min via INTRAVENOUS

## 2016-09-04 MED ORDER — MIDAZOLAM HCL 5 MG/5ML IJ SOLN
INTRAMUSCULAR | Status: DC | PRN
  Administered 2016-09-04: 2 mg via INTRAVENOUS

## 2016-09-04 MED ORDER — THROMBIN 20000 UNITS EX SOLR
OROMUCOSAL | Status: DC | PRN
  Administered 2016-09-04: 20 mL via TOPICAL

## 2016-09-04 MED ORDER — ONDANSETRON HCL 4 MG/2ML IJ SOLN
INTRAMUSCULAR | Status: DC | PRN
  Administered 2016-09-04: 4 mg via INTRAVENOUS

## 2016-09-04 MED ORDER — BUPIVACAINE LIPOSOME 1.3 % IJ SUSP
20.0000 mL | INTRAMUSCULAR | Status: DC
  Filled 2016-09-04: qty 20

## 2016-09-04 MED ORDER — THROMBIN 5000 UNITS EX SOLR
CUTANEOUS | Status: AC
  Filled 2016-09-04: qty 5000

## 2016-09-04 MED ORDER — LIDOCAINE-EPINEPHRINE 1 %-1:100000 IJ SOLN
INTRAMUSCULAR | Status: DC | PRN
  Administered 2016-09-04: 5 mL

## 2016-09-04 MED ORDER — LIDOCAINE-EPINEPHRINE 1 %-1:100000 IJ SOLN
INTRAMUSCULAR | Status: AC
  Filled 2016-09-04: qty 1

## 2016-09-04 MED ORDER — MIDAZOLAM HCL 2 MG/2ML IJ SOLN
INTRAMUSCULAR | Status: AC
  Filled 2016-09-04: qty 2

## 2016-09-04 MED ORDER — ONDANSETRON HCL 4 MG/2ML IJ SOLN: 4.0000 mg | Freq: Four times a day (QID) | INTRAMUSCULAR | Status: DC | PRN

## 2016-09-04 MED ORDER — POLYETHYLENE GLYCOL 3350 17 G PO PACK: 17.0000 g | PACK | Freq: Every day | ORAL | Status: DC | PRN

## 2016-09-04 MED ORDER — ZOLPIDEM TARTRATE 5 MG PO TABS: 5.0000 mg | ORAL_TABLET | Freq: Every evening | ORAL | Status: DC | PRN

## 2016-09-04 MED ORDER — LACTATED RINGERS IV SOLN
INTRAVENOUS | Status: DC | PRN
  Administered 2016-09-04: 08:00:00 via INTRAVENOUS

## 2016-09-04 MED ORDER — SUCCINYLCHOLINE CHLORIDE 200 MG/10ML IV SOSY
PREFILLED_SYRINGE | INTRAVENOUS | Status: AC
  Filled 2016-09-04: qty 10

## 2016-09-04 MED ORDER — PROPOFOL 10 MG/ML IV BOLUS
INTRAVENOUS | Status: DC | PRN
  Administered 2016-09-04: 50 mg via INTRAVENOUS
  Administered 2016-09-04: 150 mg via INTRAVENOUS

## 2016-09-04 MED ORDER — SODIUM CHLORIDE 0.9 % IV SOLN: 250.0000 mL | INTRAVENOUS | Status: DC

## 2016-09-04 MED ORDER — MORPHINE SULFATE (PF) 4 MG/ML IV SOLN
2.0000 mg | INTRAVENOUS | Status: DC | PRN
  Administered 2016-09-04: 4 mg via INTRAVENOUS
  Filled 2016-09-04: qty 1

## 2016-09-04 MED ORDER — MENTHOL 3 MG MT LOZG
1.0000 | LOZENGE | OROMUCOSAL | Status: DC | PRN
  Administered 2016-09-04: 3 mg via ORAL
  Filled 2016-09-04: qty 9

## 2016-09-04 MED ORDER — PANTOPRAZOLE SODIUM 40 MG PO TBEC
40.0000 mg | DELAYED_RELEASE_TABLET | Freq: Every day | ORAL | Status: DC
  Administered 2016-09-05: 40 mg via ORAL
  Filled 2016-09-04: qty 1

## 2016-09-04 MED ORDER — FLUTICASONE PROPIONATE 50 MCG/ACT NA SUSP
1.0000 | Freq: Every day | NASAL | Status: DC
  Administered 2016-09-04: 1 via NASAL
  Filled 2016-09-04: qty 16

## 2016-09-04 MED ORDER — BUPIVACAINE HCL (PF) 0.5 % IJ SOLN
INTRAMUSCULAR | Status: AC
  Filled 2016-09-04: qty 30

## 2016-09-04 MED ORDER — ONDANSETRON HCL 4 MG PO TABS
4.0000 mg | ORAL_TABLET | Freq: Four times a day (QID) | ORAL | Status: DC | PRN
  Administered 2016-09-05: 4 mg via ORAL
  Filled 2016-09-04: qty 1

## 2016-09-04 MED ORDER — SUCCINYLCHOLINE CHLORIDE 20 MG/ML IJ SOLN
INTRAMUSCULAR | Status: DC | PRN
  Administered 2016-09-04: 160 mg via INTRAVENOUS

## 2016-09-04 MED ORDER — LIDOCAINE HCL (CARDIAC) 20 MG/ML IV SOLN
INTRAVENOUS | Status: DC | PRN
  Administered 2016-09-04: 100 mg via INTRAVENOUS

## 2016-09-04 MED ORDER — BUPIVACAINE HCL (PF) 0.5 % IJ SOLN
INTRAMUSCULAR | Status: DC | PRN
  Administered 2016-09-04: 5 mL

## 2016-09-04 MED ORDER — FENTANYL CITRATE (PF) 100 MCG/2ML IJ SOLN
INTRAMUSCULAR | Status: DC | PRN
  Administered 2016-09-04: 50 ug via INTRAVENOUS
  Administered 2016-09-04: 100 ug via INTRAVENOUS
  Administered 2016-09-04 (×3): 50 ug via INTRAVENOUS
  Administered 2016-09-04: 100 ug via INTRAVENOUS
  Administered 2016-09-04 (×2): 50 ug via INTRAVENOUS

## 2016-09-04 MED ORDER — ACETAMINOPHEN 650 MG RE SUPP
650.0000 mg | RECTAL | Status: DC | PRN
Start: 2016-09-04 — End: 2016-09-05

## 2016-09-04 MED ORDER — PROPOFOL 500 MG/50ML IV EMUL
INTRAVENOUS | Status: DC | PRN
  Administered 2016-09-04: 75 ug/kg/min via INTRAVENOUS

## 2016-09-04 MED ORDER — ALUM & MAG HYDROXIDE-SIMETH 200-200-20 MG/5ML PO SUSP: 30.0000 mL | Freq: Four times a day (QID) | ORAL | Status: DC | PRN

## 2016-09-04 MED ORDER — PROMETHAZINE HCL 25 MG/ML IJ SOLN: 6.2500 mg | INTRAMUSCULAR | Status: DC | PRN

## 2016-09-04 MED ORDER — PROPOFOL 10 MG/ML IV BOLUS
INTRAVENOUS | Status: AC
  Filled 2016-09-04: qty 40

## 2016-09-04 MED ORDER — CEFAZOLIN SODIUM-DEXTROSE 2-4 GM/100ML-% IV SOLN
2.0000 g | INTRAVENOUS | Status: AC
  Administered 2016-09-04: 2 g via INTRAVENOUS
  Filled 2016-09-04: qty 100

## 2016-09-04 MED ORDER — SODIUM CHLORIDE 0.9% FLUSH: 3.0000 mL | INTRAVENOUS | Status: DC | PRN

## 2016-09-04 MED ORDER — DEXAMETHASONE SODIUM PHOSPHATE 10 MG/ML IJ SOLN
INTRAMUSCULAR | Status: DC | PRN
  Administered 2016-09-04: 10 mg via INTRAVENOUS

## 2016-09-04 MED ORDER — 0.9 % SODIUM CHLORIDE (POUR BTL) OPTIME
TOPICAL | Status: DC | PRN
  Administered 2016-09-04 (×2): 1000 mL

## 2016-09-04 MED ORDER — ACETAMINOPHEN 325 MG PO TABS: 650.0000 mg | ORAL_TABLET | ORAL | Status: DC | PRN

## 2016-09-04 MED ORDER — METHOCARBAMOL 1000 MG/10ML IJ SOLN
500.0000 mg | Freq: Four times a day (QID) | INTRAVENOUS | Status: DC | PRN
  Filled 2016-09-04: qty 5

## 2016-09-04 SURGICAL SUPPLY — 89 items
BASKET BONE COLLECTION (BASKET) ×2 IMPLANT
BIT DRILL PLIF MAS DISP 5.5MM (DRILL) ×1 IMPLANT
BLADE CLIPPER SURG (BLADE) ×3 IMPLANT
BONE CANC CHIPS 20CC PCAN1/4 (Bone Implant) ×3 IMPLANT
BUR MATCHSTICK NEURO 3.0 LAGG (BURR) ×3 IMPLANT
BUR ROUND FLUTED 5 RND (BURR) ×2 IMPLANT
BUR ROUND FLUTED 5MM RND (BURR) ×1
CAGE PLIF 8X9X23-12 LUMBAR (Cage) ×6 IMPLANT
CANISTER SUCT 3000ML PPV (MISCELLANEOUS) ×3 IMPLANT
CARTRIDGE OIL MAESTRO DRILL (MISCELLANEOUS) ×1 IMPLANT
CHIPS CANC BONE 20CC PCAN1/4 (Bone Implant) ×1 IMPLANT
CLIP NEUROVISION LG (CLIP) ×3 IMPLANT
CONT SPEC 4OZ CLIKSEAL STRL BL (MISCELLANEOUS) ×6 IMPLANT
COVER BACK TABLE 24X17X13 BIG (DRAPES) IMPLANT
COVER BACK TABLE 60X90IN (DRAPES) ×3 IMPLANT
DECANTER SPIKE VIAL GLASS SM (MISCELLANEOUS) IMPLANT
DERMABOND ADVANCED (GAUZE/BANDAGES/DRESSINGS) ×2
DERMABOND ADVANCED .7 DNX12 (GAUZE/BANDAGES/DRESSINGS) ×1 IMPLANT
DIFFUSER DRILL AIR PNEUMATIC (MISCELLANEOUS) ×3 IMPLANT
DRAPE C-ARM 42X72 X-RAY (DRAPES) ×3 IMPLANT
DRAPE C-ARMOR (DRAPES) ×3 IMPLANT
DRAPE LAPAROTOMY 100X72X124 (DRAPES) ×3 IMPLANT
DRAPE POUCH INSTRU U-SHP 10X18 (DRAPES) ×3 IMPLANT
DRAPE SURG 17X23 STRL (DRAPES) ×3 IMPLANT
DRILL PLIF MAS DISP 5.5MM (DRILL) ×3
DRSG OPSITE POSTOP 4X8 (GAUZE/BANDAGES/DRESSINGS) ×3 IMPLANT
DURAPREP 26ML APPLICATOR (WOUND CARE) ×3 IMPLANT
ELECT BLADE 4.0 EZ CLEAN MEGAD (MISCELLANEOUS) ×3
ELECT REM PT RETURN 9FT ADLT (ELECTROSURGICAL) ×3
ELECTRODE BLDE 4.0 EZ CLN MEGD (MISCELLANEOUS) ×1 IMPLANT
ELECTRODE REM PT RTRN 9FT ADLT (ELECTROSURGICAL) ×1 IMPLANT
EVACUATOR 1/8 PVC DRAIN (DRAIN) IMPLANT
GAUZE SPONGE 4X4 12PLY STRL (GAUZE/BANDAGES/DRESSINGS) IMPLANT
GAUZE SPONGE 4X4 16PLY XRAY LF (GAUZE/BANDAGES/DRESSINGS) IMPLANT
GLOVE BIO SURGEON STRL SZ8 (GLOVE) ×3 IMPLANT
GLOVE BIOGEL PI IND STRL 7.0 (GLOVE) ×2 IMPLANT
GLOVE BIOGEL PI IND STRL 7.5 (GLOVE) ×1 IMPLANT
GLOVE BIOGEL PI IND STRL 8 (GLOVE) ×1 IMPLANT
GLOVE BIOGEL PI IND STRL 8.5 (GLOVE) ×1 IMPLANT
GLOVE BIOGEL PI INDICATOR 7.0 (GLOVE) ×4
GLOVE BIOGEL PI INDICATOR 7.5 (GLOVE) ×2
GLOVE BIOGEL PI INDICATOR 8 (GLOVE) ×2
GLOVE BIOGEL PI INDICATOR 8.5 (GLOVE) ×2
GLOVE ECLIPSE 7.0 STRL STRAW (GLOVE) ×3 IMPLANT
GLOVE ECLIPSE 8.0 STRL XLNG CF (GLOVE) ×3 IMPLANT
GLOVE EXAM NITRILE LRG STRL (GLOVE) IMPLANT
GLOVE EXAM NITRILE XL STR (GLOVE) IMPLANT
GLOVE EXAM NITRILE XS STR PU (GLOVE) IMPLANT
GLOVE SURG SS PI 7.5 STRL IVOR (GLOVE) ×6 IMPLANT
GOWN STRL REUS W/ TWL LRG LVL3 (GOWN DISPOSABLE) ×2 IMPLANT
GOWN STRL REUS W/ TWL XL LVL3 (GOWN DISPOSABLE) ×1 IMPLANT
GOWN STRL REUS W/TWL 2XL LVL3 (GOWN DISPOSABLE) ×3 IMPLANT
GOWN STRL REUS W/TWL LRG LVL3 (GOWN DISPOSABLE) ×4
GOWN STRL REUS W/TWL XL LVL3 (GOWN DISPOSABLE) ×2
GRAFT BNE CANC CHIPS 1-8 20CC (Bone Implant) ×1 IMPLANT
KIT BASIN OR (CUSTOM PROCEDURE TRAY) ×3 IMPLANT
KIT POSITION SURG JACKSON T1 (MISCELLANEOUS) ×3 IMPLANT
KIT ROOM TURNOVER OR (KITS) ×3 IMPLANT
MODULE NVM5 NEXT GEN EMG (NEEDLE) ×3 IMPLANT
NEEDLE HYPO 21X1.5 SAFETY (NEEDLE) ×3 IMPLANT
NEEDLE HYPO 25X1 1.5 SAFETY (NEEDLE) ×3 IMPLANT
NEEDLE SPNL 18GX3.5 QUINCKE PK (NEEDLE) IMPLANT
NS IRRIG 1000ML POUR BTL (IV SOLUTION) ×6 IMPLANT
OIL CARTRIDGE MAESTRO DRILL (MISCELLANEOUS) ×3
PACK LAMINECTOMY NEURO (CUSTOM PROCEDURE TRAY) ×3 IMPLANT
PAD ARMBOARD 7.5X6 YLW CONV (MISCELLANEOUS) ×9 IMPLANT
PATTIES SURGICAL .5 X.5 (GAUZE/BANDAGES/DRESSINGS) IMPLANT
PATTIES SURGICAL .5 X1 (DISPOSABLE) IMPLANT
PATTIES SURGICAL 1X1 (DISPOSABLE) IMPLANT
ROD 5.5X40MM (Rod) ×6 IMPLANT
SCREW LOCK (Screw) ×8 IMPLANT
SCREW LOCK FXNS SPNE MAS PL (Screw) ×4 IMPLANT
SCREW SHANK 5.5X40MM (Screw) ×9 IMPLANT
SCREW SHANK PLIF MAS 5.5X40 (Screw) ×3 IMPLANT
SCREW TULIP 5.5 (Screw) ×9 IMPLANT
SPONGE LAP 4X18 X RAY DECT (DISPOSABLE) IMPLANT
SPONGE SURGIFOAM ABS GEL 100 (HEMOSTASIS) ×3 IMPLANT
STAPLER SKIN PROX WIDE 3.9 (STAPLE) IMPLANT
SUT VIC AB 1 CT1 18XBRD ANBCTR (SUTURE) ×1 IMPLANT
SUT VIC AB 1 CT1 8-18 (SUTURE) ×2
SUT VIC AB 2-0 CT1 18 (SUTURE) ×6 IMPLANT
SUT VIC AB 3-0 SH 8-18 (SUTURE) ×6 IMPLANT
SYR 20CC LL (SYRINGE) ×3 IMPLANT
SYR 5ML LL (SYRINGE) IMPLANT
TOWEL GREEN STERILE (TOWEL DISPOSABLE) ×3 IMPLANT
TOWEL GREEN STERILE FF (TOWEL DISPOSABLE) ×2 IMPLANT
TRAP SPECIMEN MUCOUS 40CC (MISCELLANEOUS) ×3 IMPLANT
TRAY FOLEY W/METER SILVER 16FR (SET/KITS/TRAYS/PACK) ×3 IMPLANT
WATER STERILE IRR 1000ML POUR (IV SOLUTION) IMPLANT

## 2016-09-04 NOTE — Brief Op Note (Signed)
09/04/2016  10:43 AM  PATIENT:  Seth Hampton  56 y.o. male  PRE-OPERATIVE DIAGNOSIS:  Lumbar foraminal stenosis, spondylosis, herniated lumbar disc, radiculopathy, lumbago L 45 level  POST-OPERATIVE DIAGNOSIS:   Lumbar foraminal stenosis, spondylosis, herniated lumbar disc, radiculopathy, lumbago L 45 level  PROCEDURE:  Procedure(s): Exploration of Lumbar Five-Sacral One fusion with Lumbar Four-Five Maximum access posterior lumbar interbody fusion (N/A) with PEEK cages, autograft, pedicle screw fixation and posterolateral arthrodesis.  Decompression greater than for standard PLIF procedure  SURGEON:  Surgeon(s) and Role:    Maeola Harman* Whyatt Klinger, MD - Primary    * Lisbeth RenshawNundkumar, Neelesh, MD - Assisting  PHYSICIAN ASSISTANT:   ASSISTANTS: Poteat, RN   ANESTHESIA:   general  EBL:  Total I/O In: 2000 [I.V.:2000] Out: 375 [Urine:175; Blood:200]  BLOOD ADMINISTERED:none  DRAINS: none   LOCAL MEDICATIONS USED:  MARCAINE    and LIDOCAINE   SPECIMEN:  No Specimen  DISPOSITION OF SPECIMEN:  N/A  COUNTS:  YES  TOURNIQUET:  * No tourniquets in log *  DICTATION: DICTATION: Patient is a 56 year old with spondylosis , stenosis, disc herniation and severe back and right lower extremity pain at L4/5 levels of the lumbar spine. It was elected to take him to surgery for MASPLIF L 45 level with posterolateral arthrodesis along with exloration of prior L 5 S 1 fusion.  Procedure:   Following uncomplicated induction of GETA, and placement of electrodes for neural monitoring, patient was turned into a prone position on the [place] tableand using AP  fluoroscopy the area of planned incision was marked, prepped with betadine scrub and Duraprep, then draped. Exposure was performed of facet joint complex at L 45 level along with previously placed L 5 S 1 hardware and the MAS retractor was placed.The previous hardware was removed.  All screws appeared to be solidly in bone without apparent loosening.  There  appeared to be no abnormal motion at the L 5 S 1 level and there appeared to be solid bridging bone in the posterolateral region.  It was therefore felt that there was a solid arthrodesis at the previously operated level. 5.5 x 40 mm cortical Nuvasive screws were placed at L 4 bilaterally according to standard landmarks using neural monitoring.  A total laminectomy of L 4 was then performed with disarticulation of facets.  This bone was saved for grafting, combined with 20 cc allograft (reserved for posterolateral bone grafting) after being run through bone mill and was placed in bone packing device.  Thorough discectomy was performed bilaterally at L 45 and the endplates were prepared for grafting.  There was a lateral osteophyte and disc herniation which was compressing the exiting L 4 nerve root on the right.  This was thoroughly decompressed and bone contributing to nerve root compression was removed.  Decompression was greater than for standard PLIF procedure and all neural elements, including L 4, L 5 nerve roots and thecal sac were thoroughly decompressed.  23 x 8 x 12 degree cages were placed in the interspace and positioning was confirmed with AP and lateral fluoroscopy.  10 cc of autograft was packed in the interspace medial to the second cage.   Remaining screws were placed at L 5 ( 5.5 x 40) and 35 mm rods were placed.   And the screws were locked and torqued. Final Xrays showed well positioned implants and screw fixation. The posterolateral region was packed with remaining 10 cc of autograft on each side of midline. The wounds were irrigated and then  closed with 1, 2-0 and 3-0 Vicryl stitches. Sterile occlusive dressing was placed with Dermabond. The patient was then extubated in the operating room and taken to recovery in stable and satisfactory condition having tolerated her operation well. Counts were correct at the end of the case.  PLAN OF CARE: Admit to inpatient   PATIENT DISPOSITION:  PACU -  hemodynamically stable.   Delay start of Pharmacological VTE agent (>24hrs) due to surgical blood loss or risk of bleeding: yes

## 2016-09-04 NOTE — Progress Notes (Signed)
Awake, alert, conversant.  Back sore.  Leg pain better.  Full bilateral PF/DF/EHL.  Patient is doing well.

## 2016-09-04 NOTE — Anesthesia Postprocedure Evaluation (Signed)
Anesthesia Post Note  Patient: Seth Hampton  Procedure(s) Performed: Procedure(s) (LRB): Exploration of Lumbar Five-Sacral One fusion with Lumbar Four-Five Maximum access posterior lumbar interbody fusion (N/A)  Patient location during evaluation: PACU Anesthesia Type: General Level of consciousness: awake and alert Pain management: pain level controlled Vital Signs Assessment: post-procedure vital signs reviewed and stable Respiratory status: spontaneous breathing, nonlabored ventilation and respiratory function stable Cardiovascular status: blood pressure returned to baseline and stable Postop Assessment: no signs of nausea or vomiting Anesthetic complications: no       Last Vitals:  Vitals:   09/04/16 1200 09/04/16 1234  BP: 135/90 (!) 145/91  Pulse: 82 75  Resp: 19 20  Temp: 36.7 C 36.5 C    Last Pain:  Vitals:   09/04/16 1230  TempSrc:   PainSc: 3                  Ferdie Bakken,W. EDMOND

## 2016-09-04 NOTE — Anesthesia Preprocedure Evaluation (Addendum)
Anesthesia Evaluation  Patient identified by MRN, date of birth, ID band Patient awake    Reviewed: Allergy & Precautions, NPO status , Patient's Chart, lab work & pertinent test results  Airway Mallampati: II  TM Distance: <3 FB Neck ROM: Full    Dental  (+) Teeth Intact, Dental Advisory Given, Caps, Chipped   Pulmonary sleep apnea (13 cmH2O) and Continuous Positive Airway Pressure Ventilation , former smoker,    Pulmonary exam normal breath sounds clear to auscultation       Cardiovascular hypertension, Normal cardiovascular exam Rhythm:Regular Rate:Normal     Neuro/Psych PSYCHIATRIC DISORDERS Anxiety negative neurological ROS     GI/Hepatic Neg liver ROS, GERD  Medicated and Controlled,  Endo/Other  Obesity   Renal/GU negative Renal ROS     Musculoskeletal  (+) Arthritis , Osteoarthritis,    Abdominal   Peds  Hematology negative hematology ROS (+)   Anesthesia Other Findings Day of surgery medications reviewed with the patient.  Reproductive/Obstetrics                           Anesthesia Physical Anesthesia Plan  ASA: II  Anesthesia Plan: General   Post-op Pain Management:    Induction: Intravenous  Airway Management Planned: Oral ETT  Additional Equipment:   Intra-op Plan:   Post-operative Plan: Extubation in OR  Informed Consent: I have reviewed the patients History and Physical, chart, labs and discussed the procedure including the risks, benefits and alternatives for the proposed anesthesia with the patient or authorized representative who has indicated his/her understanding and acceptance.   Dental advisory given  Plan Discussed with: CRNA  Anesthesia Plan Comments: (Risks/benefits of general anesthesia discussed with patient including risk of damage to teeth, lips, gum, and tongue, nausea/vomiting, allergic reactions to medications, and the possibility of heart  attack, stroke and death.  All patient questions answered.  Patient wishes to proceed.)        Anesthesia Quick Evaluation

## 2016-09-04 NOTE — Interval H&P Note (Signed)
History and Physical Interval Note:  09/04/2016 7:16 AM  Seth Hampton  has presented today for surgery, with the diagnosis of Lumbar foraminal stenosis  The various methods of treatment have been discussed with the patient and family. After consideration of risks, benefits and other options for treatment, the patient has consented to  Procedure(s) with comments: Exploration of L5-S1 fusion with L 4-L 5 Maximum access posterior lumbar interbody fusion (N/A) - Exploration of L5-S1 fusion with L 4 - L 5 Maximum access posterior lumbar interbody fusion as a surgical intervention .  The patient's history has been reviewed, patient examined, no change in status, stable for surgery.  I have reviewed the patient's chart and labs.  Questions were answered to the patient's satisfaction.     [name]

## 2016-09-04 NOTE — Evaluation (Signed)
Physical Therapy Evaluation Patient Details Name: Seth Hampton MRN: [id number] DOB: 03/10/1961 Today's Date: 09/04/2016   History of Present Illness  Patient is a 56 year old with spondylosis , stenosis, disc herniation and severe back and right lower extremity pain at L4/5 levels of the lumbar spine. Surgery completed for MASPLIF L 4-5 level with posterolateral arthrodesis along with exloration of prior L 5 S 1 fusion.  Clinical Impression  Pt demonstrated mod I functional mobility with gait and transfer. Pt limited by fatigue and pain secondary to surgery earlier this AM. Pt was independent prior to surgery. Further PT not recommended due to pt demonstrating safety and understanding of education and spinal precautions upon d/c home with family assistance as needed.     Follow Up Recommendations No PT follow up    Equipment Recommendations  None recommended by PT    Recommendations for Other Services       Precautions / Restrictions Precautions Precautions: Back Precaution Booklet Issued: Yes (comment) Precaution Comments: PT provided handout for spinal precautions Required Braces or Orthoses: Spinal Brace Spinal Brace: Lumbar corset Restrictions Weight Bearing Restrictions: No      Mobility  Bed Mobility Overal bed mobility: Modified Independent             General bed mobility comments: Pt demonstrated good technique for sidelying to sit method, but required extra time and use of bed rails due to pain.   Transfers Overall transfer level: Modified independent Equipment used: None             General transfer comment: Guarding during sit <> stand and required extra time.   Ambulation/Gait Ambulation/Gait assistance: Modified independent (Device/Increase time) Ambulation Distance (Feet): 300 Feet Assistive device: None (IV pole for ~ 15550ft) Gait Pattern/deviations: Step-through pattern;Decreased stride length Gait velocity: decreased  Gait velocity  interpretation: Below normal speed for age/gender General Gait Details: VCs required to maintain increased gait speed. Pt reported slight nausea initially   Stairs Stairs: Yes Stairs assistance: Modified independent (Device/Increase time) Stair Management: One rail Right;Step to pattern Number of Stairs: 4 General stair comments: PT provided VCs to go up stairs forward rather than sideways to prevent twisting.   Wheelchair Mobility    Modified Rankin (Stroke Patients Only)       Balance Overall balance assessment: Independent                                           Pertinent Vitals/Pain Pain Assessment: 0-10 Pain Score: 7  Pain Location: Incision and R hip Pain Descriptors / Indicators: Aching;Sore Pain Intervention(s): Monitored during session;Repositioned;Patient requesting pain meds-RN notified    Home Living Family/patient expects to be discharged to:: Private residence Living Arrangements: Spouse/significant other Available Help at Discharge: Family Type of Home: House Home Access: Stairs to enter Entrance Stairs-Rails: Can reach both Entrance Stairs-Number of Steps: 4 Home Layout: One level Home Equipment: Cane - single point;Bedside commode      Prior Function Level of Independence: Independent         Comments: Pt used SPC prn during prolonged ambulation prior to surgery.      Hand Dominance   Dominant Hand: Right    Extremity/Trunk Assessment   Upper Extremity Assessment Upper Extremity Assessment: Overall WFL for tasks assessed    Lower Extremity Assessment Lower Extremity Assessment: Overall WFL for tasks assessed    Cervical / Trunk Assessment Cervical /  Trunk Assessment: Other exceptions Cervical / Trunk Exceptions: spinal precautions   Communication   Communication: No difficulties  Cognition Arousal/Alertness: Awake/alert Behavior During Therapy: WFL for tasks assessed/performed Overall Cognitive Status: Within  Functional Limits for tasks assessed                                        General Comments  PT provided education regarding pain management, activity tolerance, spinal precautions, and safety with car transfers.     Exercises     Assessment/Plan    PT Assessment Patent does not need any further PT services  PT Problem List         PT Treatment Interventions      PT Goals (Current goals can be found in the Care Plan section)  Acute Rehab PT Goals Patient Stated Goal: to go home  PT Goal Formulation: With patient/family Time For Goal Achievement: 09/18/16 Potential to Achieve Goals: Good    Frequency     Barriers to discharge        Co-evaluation               AM-PAC PT "6 Clicks" Daily Activity  Outcome Measure Difficulty turning over in bed (including adjusting bedclothes, sheets and blankets)?: A Little Difficulty moving from lying on back to sitting on the side of the bed? : A Little Difficulty sitting down on and standing up from a chair with arms (e.g., wheelchair, bedside commode, etc,.)?: A Little Help needed moving to and from a bed to chair (including a wheelchair)?: A Little Help needed walking in hospital room?: A Little Help needed climbing 3-5 steps with a railing? : A Little 6 Click Score: 18    End of Session Equipment Utilized During Treatment: Gait belt;Back brace Activity Tolerance: Patient limited by fatigue;Patient limited by pain Patient left: in bed;with family/visitor present Nurse Communication: Mobility status;Patient requests pain meds PT Visit Diagnosis: Difficulty in walking, not elsewhere classified (R26.2);Pain Pain - Right/Left: Right Pain - part of body: Hip    Time: 9811-9147 PT Time Calculation (min) (ACUTE ONLY): 28 min   Charges:   PT Evaluation $PT Eval Low Complexity: 1 Procedure     PT G Codes:   PT G-Codes **NOT FOR INPATIENT CLASS** Functional Assessment Tool Used: Clinical  judgement Functional Limitation: Mobility: Walking and moving around Mobility: Walking and Moving Around Current Status (W2956): At least 1 percent but less than 20 percent impaired, limited or restricted Mobility: Walking and Moving Around Goal Status [phone]): At least 1 percent but less than 20 percent impaired, limited or restricted Mobility: Walking and Moving Around Discharge Status [phone]): At least 1 percent but less than 20 percent impaired, limited or restricted    Corlis Leak, SPT  [phone]   Corlis Leak  09/04/2016, 5:01 PM

## 2016-09-04 NOTE — Transfer of Care (Signed)
Immediate Anesthesia Transfer of Care Note  Patient: Seth Hampton  Procedure(s) Performed: Procedure(s): Exploration of Lumbar Five-Sacral One fusion with Lumbar Four-Five Maximum access posterior lumbar interbody fusion (N/A)  Patient Location: PACU  Anesthesia Type:General  Level of Consciousness: awake, oriented and patient cooperative  Airway & Oxygen Therapy: Patient Spontanous Breathing and Patient connected to face mask oxygen  Post-op Assessment: Report given to RN and Post -op Vital signs reviewed and stable  Post vital signs: Reviewed  Last Vitals:  Vitals:   09/04/16 0626 09/04/16 1058  BP: (!) 141/95 (!) 131/95  Pulse: 68 91  Resp: 19 (!) 8  Temp: 36.5 C 36.1 C    Last Pain:  Vitals:   09/04/16 0626  TempSrc: Oral  PainSc:          Complications: No apparent anesthesia complications

## 2016-09-04 NOTE — Op Note (Signed)
09/04/2016  10:43 AM  PATIENT:  Seth Hampton  56 y.o. male  PRE-OPERATIVE DIAGNOSIS:  Lumbar foraminal stenosis, spondylosis, herniated lumbar disc, radiculopathy, lumbago L 45 level  POST-OPERATIVE DIAGNOSIS:   Lumbar foraminal stenosis, spondylosis, herniated lumbar disc, radiculopathy, lumbago L 45 level  PROCEDURE:  Procedure(s): Exploration of Lumbar Five-Sacral One fusion with Lumbar Four-Five Maximum access posterior lumbar interbody fusion (N/A) with PEEK cages, autograft, pedicle screw fixation and posterolateral arthrodesis.  Decompression greater than for standard PLIF procedure  SURGEON:  Surgeon(s) and Role:    Maeola Harman* Rydge Texidor, MD - Primary    * Lisbeth RenshawNundkumar, Neelesh, MD - Assisting  PHYSICIAN ASSISTANT:   ASSISTANTS: Poteat, RN   ANESTHESIA:   general  EBL:  Total I/O In: 2000 [I.V.:2000] Out: 375 [Urine:175; Blood:200]  BLOOD ADMINISTERED:none  DRAINS: none   LOCAL MEDICATIONS USED:  MARCAINE    and LIDOCAINE   SPECIMEN:  No Specimen  DISPOSITION OF SPECIMEN:  N/A  COUNTS:  YES  TOURNIQUET:  * No tourniquets in log *  DICTATION: DICTATION: Patient is a 56 year old with spondylosis , stenosis, disc herniation and severe back and right lower extremity pain at L4/5 levels of the lumbar spine. It was elected to take him to surgery for MASPLIF L 45 level with posterolateral arthrodesis along with exloration of prior L 5 S 1 fusion.  Procedure:   Following uncomplicated induction of GETA, and placement of electrodes for neural monitoring, patient was turned into a prone position on the [place] tableand using AP  fluoroscopy the area of planned incision was marked, prepped with betadine scrub and Duraprep, then draped. Exposure was performed of facet joint complex at L 45 level along with previously placed L 5 S 1 hardware and the MAS retractor was placed.The previous hardware was removed.  All screws appeared to be solidly in bone without apparent loosening.  There  appeared to be no abnormal motion at the L 5 S 1 level and there appeared to be solid bridging bone in the posterolateral region.  It was therefore felt that there was a solid arthrodesis at the previously operated level. 5.5 x 40 mm cortical Nuvasive screws were placed at L 4 bilaterally according to standard landmarks using neural monitoring.  A total laminectomy of L 4 was then performed with disarticulation of facets.  This bone was saved for grafting, combined with 20 cc allograft (reserved for posterolateral bone grafting) after being run through bone mill and was placed in bone packing device.  Thorough discectomy was performed bilaterally at L 45 and the endplates were prepared for grafting.  There was a lateral osteophyte and disc herniation which was compressing the exiting L 4 nerve root on the right.  This was thoroughly decompressed and bone contributing to nerve root compression was removed.  Decompression was greater than for standard PLIF procedure and all neural elements, including L 4, L 5 nerve roots and thecal sac were thoroughly decompressed.  23 x 8 x 12 degree cages were placed in the interspace and positioning was confirmed with AP and lateral fluoroscopy.  10 cc of autograft was packed in the interspace medial to the second cage.   Remaining screws were placed at L 5 ( 5.5 x 40) and 35 mm rods were placed.   And the screws were locked and torqued. Final Xrays showed well positioned implants and screw fixation. The posterolateral region was packed with remaining 10 cc of autograft on each side of midline. The wounds were irrigated and then  closed with 1, 2-0 and 3-0 Vicryl stitches. Sterile occlusive dressing was placed with Dermabond. The patient was then extubated in the operating room and taken to recovery in stable and satisfactory condition having tolerated her operation well. Counts were correct at the end of the case.  PLAN OF CARE: Admit to inpatient   PATIENT DISPOSITION:  PACU -  hemodynamically stable.   Delay start of Pharmacological VTE agent (>24hrs) due to surgical blood loss or risk of bleeding: yes

## 2016-09-04 NOTE — Anesthesia Procedure Notes (Addendum)
Procedure Name: Intubation Date/Time: 09/04/2016 7:39 AM Performed by: Jenne Campus Pre-anesthesia Checklist: Patient identified, Emergency Drugs available, Suction available and Patient being monitored Patient Re-evaluated:Patient Re-evaluated prior to inductionOxygen Delivery Method: Circle System Utilized Preoxygenation: Pre-oxygenation with 100% oxygen Intubation Type: IV induction Ventilation: Mask ventilation without difficulty and Oral airway inserted - appropriate to patient size Laryngoscope Size: Mac and 4 Grade View: Grade I Tube type: Oral Tube size: 8.0 mm Number of attempts: 1 Airway Equipment and Method: Stylet and Oral airway Placement Confirmation: ETT inserted through vocal cords under direct vision,  positive ETCO2 and breath sounds checked- equal and bilateral Secured at: 22 cm Tube secured with: Tape Dental Injury: Teeth and Oropharynx as per pre-operative assessment

## 2016-09-04 NOTE — Progress Notes (Signed)
Physical Therapy Discharge Patient Details Name: Seth MedalKevin Scorza MRN: [id number] DOB: 12/02/1960 Today's Date: 09/04/2016 Time: [id number] PT Time Calculation (min) (ACUTE ONLY): 28 min  Patient discharged from PT services secondary to all education and assessment complete .  Please see latest therapy progress note for current level of functioning and progress toward goals.    Progress and discharge plan discussed with patient and/or caregiver: Patient/Caregiver agrees with plan  GP     Corlis Leakaylor Guardalabene, SPT  09/04/2016, 4:54 PM

## 2016-09-05 ENCOUNTER — Encounter (HOSPITAL_COMMUNITY): Payer: Self-pay | Admitting: Neurosurgery

## 2016-09-05 LAB — URINE CULTURE: Culture: NO GROWTH

## 2016-09-05 MED ORDER — METHOCARBAMOL 500 MG PO TABS: 500.0000 mg | ORAL_TABLET | Freq: Four times a day (QID) | ORAL | 1 refills | Status: AC | PRN

## 2016-09-05 MED ORDER — HYDROCODONE-ACETAMINOPHEN 5-325 MG PO TABS: 1.0000 | ORAL_TABLET | ORAL | 0 refills | Status: AC | PRN

## 2016-09-05 NOTE — Progress Notes (Signed)
Subjective: Patient reports "I feel pretty good. My legs don't hurt at all"  Objective: Vital signs in last 24 hours: Temp:  [97 F (36.1 C)-98.7 F (37.1 C)] 98.3 F (36.8 C) (05/09 0736) Pulse Rate:  [71-94] 77 (05/09 0736) Resp:  [8-21] 18 (05/09 0736) BP: (122-145)/(69-95) 137/69 (05/09 0736) SpO2:  [95 %-100 %] 95 % (05/09 0736) Weight:  [108.9 kg (240 lb 1.6 oz)] 108.9 kg (240 lb 1.6 oz) (05/08 1300)  Intake/Output from previous day: 05/08 0701 - 05/09 0700 In: 2460 [P.O.:360; I.V.:2000; IV Piggyback:100] Out: 550 [Urine:350; Blood:200] Intake/Output this shift: Total I/O In: 240 [P.O.:240] Out: -   Alert, conversant. Good strength BLE. Incision flat without erythema or drainage beneath honeycomb & Dermabond. Some nausea - responded well to meds this am. Ambulated with PT.   Lab Results: No results for input(s): WBC, HGB, HCT, PLT in the last 72 hours. BMET No results for input(s): NA, K, CL, CO2, GLUCOSE, BUN, CREATININE, CALCIUM in the last 72 hours.  Studies/Results: Dg Lumbar Spine 2-3 Views  Result Date: 09/04/2016 CLINICAL DATA:  L4-L5 MAS PLIF, removal of hardware at S1 EXAM: DG C-ARM 61-120 MIN; LUMBAR SPINE - 2-3 VIEW COMPARISON:  MRI lumbar spine 08/09/2016 FLUOROSCOPY TIME:  0 minutes 38 seconds FINDINGS: Prior MR has no labeling. Current exam is labeled presumptively with 5 lumbar vertebra. BILATERAL pedicle screws present at L4 and L5. Disc prostheses at L4-L5 and L5-S1 disc spaces. Bones appear demineralized. No fracture or subluxation. IMPRESSION: Intraoperative images during posterior fusion of L4-L5 as above. Electronically Signed   By: Ulyses SouthwardMark  Boles M.D.   On: 09/04/2016 10:39   Dg C-arm 1-60 Min  Result Date: 09/04/2016 CLINICAL DATA:  L4-L5 MAS PLIF, removal of hardware at S1 EXAM: DG C-ARM 61-120 MIN; LUMBAR SPINE - 2-3 VIEW COMPARISON:  MRI lumbar spine 08/09/2016 FLUOROSCOPY TIME:  0 minutes 38 seconds FINDINGS: Prior MR has no labeling. Current exam is  labeled presumptively with 5 lumbar vertebra. BILATERAL pedicle screws present at L4 and L5. Disc prostheses at L4-L5 and L5-S1 disc spaces. Bones appear demineralized. No fracture or subluxation. IMPRESSION: Intraoperative images during posterior fusion of L4-L5 as above. Electronically Signed   By: Ulyses SouthwardMark  Boles M.D.   On: 09/04/2016 10:39    Assessment/Plan: Improving  LOS: 1 day  Per DrStern, d/c to home. Rx's for Norco & Robaxin to chart for prn home use. Pt has f/u appt with DrStern scheduled. He & his wife verbalize understanding of d/c instructions.   Georgiann Cockeroteat, Arles Rumbold 09/05/2016, 7:41 AM

## 2016-09-05 NOTE — Discharge Summary (Signed)
Physician Discharge Summary  Patient ID: Seth Hampton MRN: [id number] DOB/AGE: 55/11/1960 55 y.o.  Admit date: 09/04/2016 Discharge date: 09/05/2016  Admission Diagnoses:Lumbar foraminal stenosis, spondylosis, herniated lumbar disc, radiculopathy, lumbago L 45 level     Discharge Diagnoses: Lumbar foraminal stenosis, spondylosis, herniated lumbar disc, radiculopathy, lumbago L 45 level s/p Exploration of Lumbar Five-Sacral One fusion with Lumbar Four-Five Maximum access posterior lumbar interbody fusion (N/A) with PEEK cages, autograft, pedicle screw fixation and posterolateral arthrodesis.Decompression greater than for standard PLIF procedure    Active Problems:   Lumbar foraminal stenosis   Discharged Condition: good  Hospital Course: Seth Hampton was admitted for surgery with dx lumbar stenosis, radiculopathy. Following uncomplicated decompression and fusion L4-5 with exploration of L5-S1 fusion, he recovered nicely and transferred to [facility] for nursing care and therapies. He has mobilized well.   Consults: None  Significant Diagnostic Studies: radiology: X-Ray: intra-op  Treatments: surgery: Exploration of Lumbar Five-Sacral One fusion with Lumbar Four-Five Maximum access posterior lumbar interbody fusion (N/A) with PEEK cages, autograft, pedicle screw fixation and posterolateral arthrodesis.Decompression greater than for standard PLIF procedure     Discharge Exam: Blood pressure 137/69, pulse 77, temperature 98.3 F (36.8 C), temperature source Oral, resp. rate 18, height 6' (1.829 m), weight 108.9 kg (240 lb 1.6 oz), SpO2 95 %. Alert, conversant. Good strength BLE. Incision flat without erythema or drainage beneath honeycomb & Dermabond. Some nausea - responded well to meds this am. Ambulated with PT.      Disposition: 01-Home or Self Care  Rx's for Norco & Robaxin to chart for prn home use. Pt has f/u appt with DrStern scheduled. He & his wife verbalize understanding of d/c  instructions.     Discharge Instructions    Diet - low sodium heart healthy    Complete by:  As directed    Increase activity slowly    Complete by:  As directed      Allergies as of 09/05/2016      Reactions   No Known Allergies       Medication List    STOP taking these medications   ibuprofen 800 MG tablet Commonly known as:  ADVIL,MOTRIN     TAKE these medications   fluticasone 50 MCG/ACT nasal spray Commonly known as:  FLONASE Place 1 spray into both nostrils at bedtime.   HYDROcodone-acetaminophen 5-325 MG tablet Commonly known as:  NORCO/VICODIN Take 1-2 tablets by mouth every 4 (four) hours as needed (breakthrough pain).   methocarbamol 500 MG tablet Commonly known as:  ROBAXIN Take 1 tablet (500 mg total) by mouth every 6 (six) hours as needed for muscle spasms.   omeprazole 20 MG capsule Commonly known as:  PRILOSEC Take 20 mg by mouth daily.   oxyCODONE-acetaminophen 5-325 MG tablet Commonly known as:  PERCOCET/ROXICET Take 1 tablet by mouth every 6 (six) hours as needed for moderate pain or severe pain.   tiZANidine 2 MG tablet Commonly known as:  ZANAFLEX Take 2 mg by mouth every 6 (six) hours as needed for muscle spasms.        Signed: Georgiann Cockeroteat, Chistopher Mangino 09/05/2016, 7:45 AM

## 2016-09-05 NOTE — Progress Notes (Signed)
Patient alert and oriented, mae's well, voiding adequate amount of urine, swallowing without difficulty, c/o mild pain. Patient discharged home with family. Script and discharged instructions given to patient. Patient and family stated understanding of d/c instructions given and has an appointment with Dr. Venetia MaxonStern

## 2016-09-05 NOTE — Discharge Instructions (Signed)

## 2016-09-05 NOTE — Evaluation (Signed)
Occupational Therapy Evaluation Patient Details Name: Seth Hampton MRN: [id number] DOB: July 04, 1960 Today's Date: 09/05/2016    History of Present Illness Patient is a 57 year old with spondylosis , stenosis, disc herniation and severe back and right lower extremity pain at L4/5 levels of the lumbar spine. Surgery completed for MASPLIF L 4-5 level with posterolateral arthrodesis along with exloration of prior L 5 S 1 fusion.   Clinical Impression   PTA, pt was living with his wife and was independent. Currently. Pt performs ADLs and functional mobility at a Mod I level with need for increased time and AE. Pt recalled 3/3 back precautions and demonstrates understanding of safe LB dressing, bed mobility, and transfers. All pt questions answered and education provided. Recommend dc home once medically stable per physician. All acute OT needs met and will sign off. Thank you.    Follow Up Recommendations  No OT follow up;Supervision - Intermittent    Equipment Recommendations  None recommended by OT (Pt confirms that he has needed DME/AE)    Recommendations for Other Services       Precautions / Restrictions Precautions Precautions: Back Precaution Booklet Issued: Yes (comment) Precaution Comments: Reviewed spinal precautions. Pt and wife able to recall 3/3  Required Braces or Orthoses: Spinal Brace Spinal Brace: Lumbar corset Restrictions Weight Bearing Restrictions: No      Mobility Bed Mobility Overal bed mobility: Modified Independent             General bed mobility comments: Pt demonstrated good technique for sidelying to sit method, but required extra time. Practiced Log roll without bed rail with pt demosntrating good technique.   Transfers Overall transfer level: Modified independent Equipment used: None             General transfer comment: Guarding during sit <> stand and required extra time.     Balance Overall balance assessment: Independent                                          ADL either performed or assessed with clinical judgement   ADL Overall ADL's : Modified independent                                       General ADL Comments: Pt Mod I for ADLs and demosntrates understanding of AE for LB ADLs. Also, wife confirms that she will A with ADLs and shower transfer     Vision         Perception     Praxis      Pertinent Vitals/Pain Pain Assessment: Faces Faces Pain Scale: Hurts a little bit Pain Location: Incision and R hip Pain Descriptors / Indicators: Aching;Sore Pain Intervention(s): Monitored during session     Hand Dominance Right   Extremity/Trunk Assessment Upper Extremity Assessment Upper Extremity Assessment: Overall WFL for tasks assessed   Lower Extremity Assessment Lower Extremity Assessment: Overall WFL for tasks assessed   Cervical / Trunk Assessment Cervical / Trunk Assessment: Other exceptions Cervical / Trunk Exceptions: spinal precautions    Communication Communication Communication: No difficulties   Cognition Arousal/Alertness: Awake/alert Behavior During Therapy: WFL for tasks assessed/performed Overall Cognitive Status: Within Functional Limits for tasks assessed  General Comments  Wife present through out session    Exercises     Shoulder Instructions      Home Living Family/patient expects to be discharged to:: Private residence Living Arrangements: Spouse/significant other Available Help at Discharge: Family Type of Home: House Home Access: Stairs to enter Technical brewer of Steps: 4 Entrance Stairs-Rails: Can reach both Entiat: One level     Bathroom Shower/Tub: Occupational psychologist: Handicapped height Bathroom Accessibility: No   Home Equipment: Dola - single point;Bedside commode;Tub bench;Hand held shower head;Adaptive equipment Adaptive Equipment:  Reacher;Sock aid;Long-handled shoe horn;Long-handled sponge (Have AE from prior back surgery last year)        Prior Functioning/Environment Level of Independence: Independent                 OT Problem List: Decreased range of motion;Decreased activity tolerance;Pain;Decreased knowledge of precautions;Decreased knowledge of use of DME or AE      OT Treatment/Interventions:      OT Goals(Current goals can be found in the care plan section) Acute Rehab OT Goals Patient Stated Goal: Return home OT Goal Formulation: With patient Time For Goal Achievement: 09/19/16 Potential to Achieve Goals: Good  OT Frequency:     Barriers to D/C:            Co-evaluation              AM-PAC PT "6 Clicks" Daily Activity     Outcome Measure Help from another person eating meals?: None Help from another person taking care of personal grooming?: None Help from another person toileting, which includes using toliet, bedpan, or urinal?: None Help from another person bathing (including washing, rinsing, drying)?: A Little Help from another person to put on and taking off regular upper body clothing?: None Help from another person to put on and taking off regular lower body clothing?: A Little 6 Click Score: 22   End of Session Nurse Communication: Mobility status;Precautions  Activity Tolerance: Patient tolerated treatment well Patient left: in bed;with call bell/phone within reach;with family/visitor present  OT Visit Diagnosis: Pain;Other (comment) (Spinal precautions) Pain - Right/Left:  (Lower back) Pain - part of body:  (Lower back)                Time: 4599-7741 OT Time Calculation (min): 10 min Charges:  OT General Charges $OT Visit: 1 Procedure OT Evaluation $OT Eval Low Complexity: 1 Procedure G-Codes:     Rowyn Mustapha, OTR/L [phone]  [place] 09/05/2016, 8:21 AM

## 2016-12-08 IMAGING — RF DG LUMBAR SPINE 2-3V
1 series · 2 of 2 positions shown · non-contrast
Comparison: None.

CLINICAL DATA: L5-S1 fixation

EXAM:
DG C-ARM 61-120 MIN; LUMBAR SPINE - 2-3 VIEW

[Series 1: run · 2 of 2 slices shown]
[im 1/2]
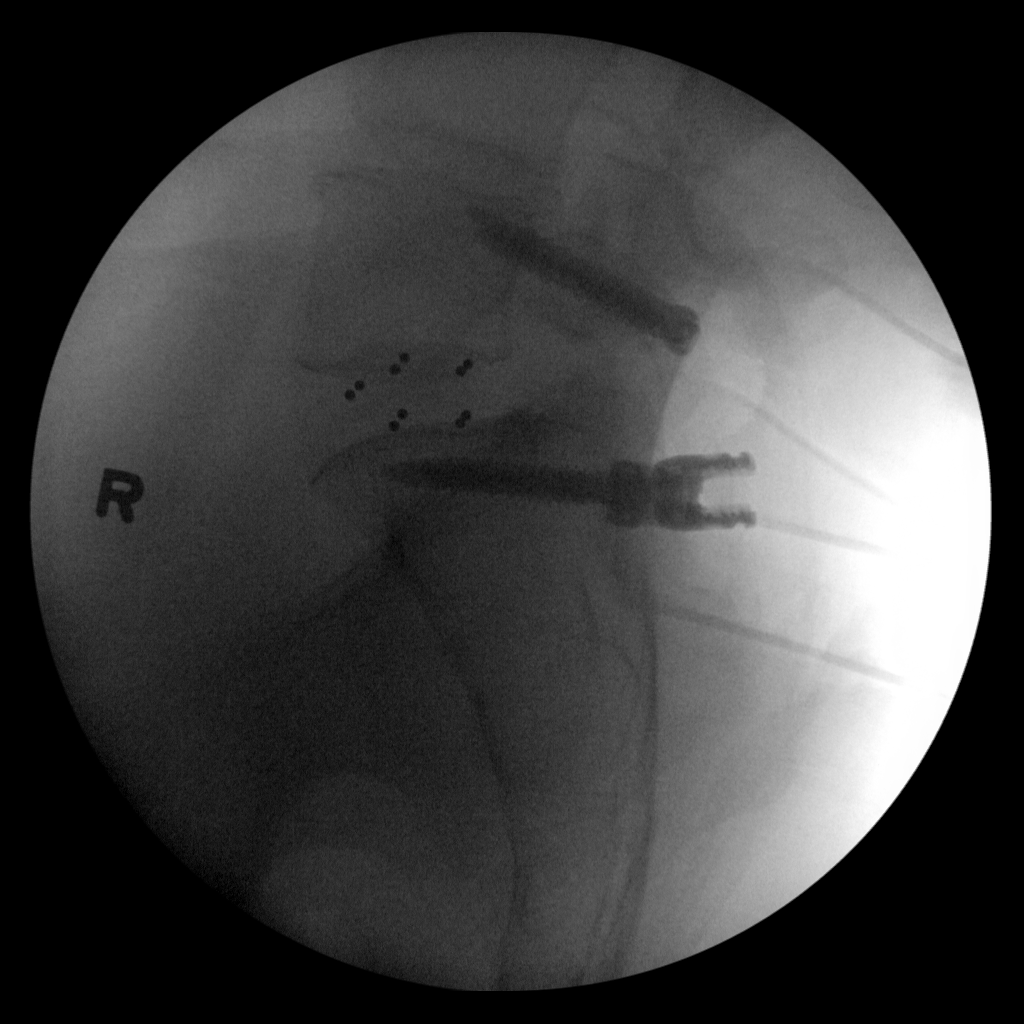
[im 2/2]
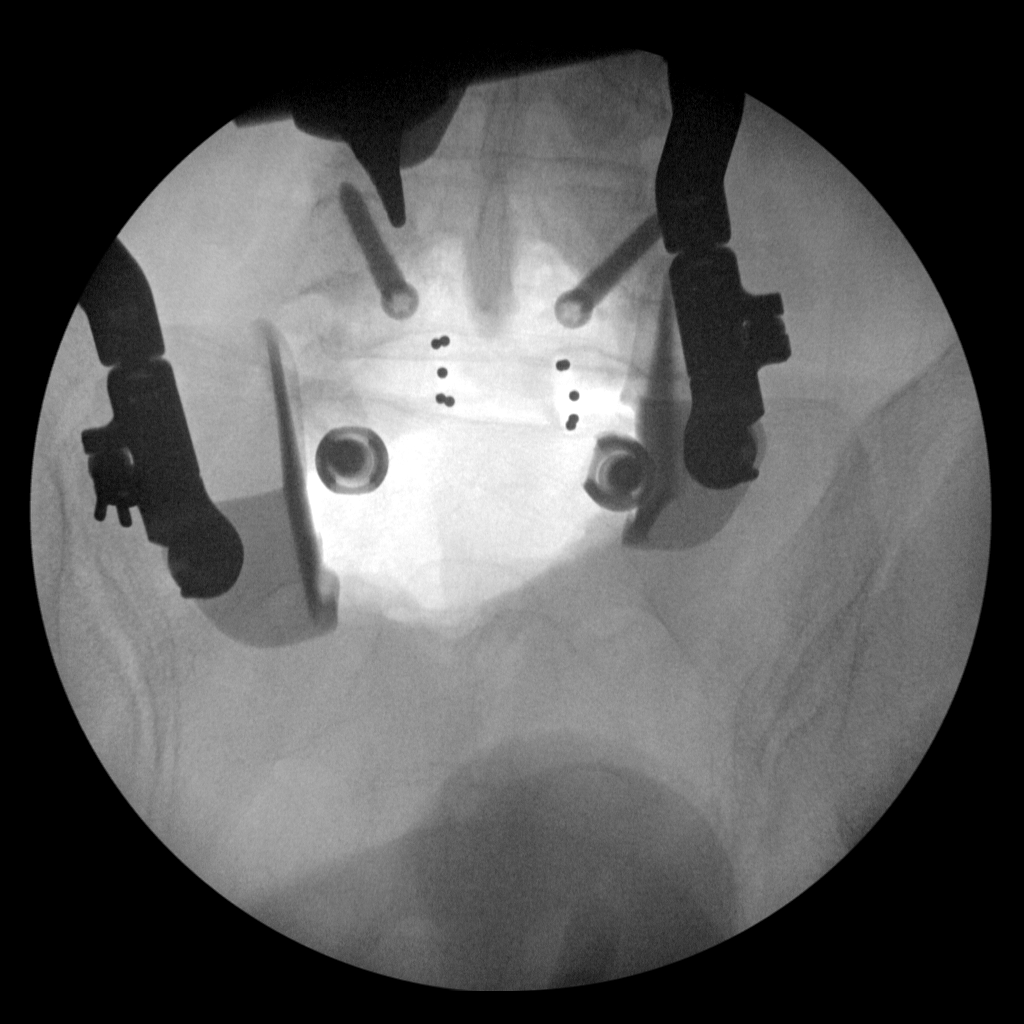

[2 of 2 positions shown; findings below may reference images not displayed]

FLUOROSCOPY TIME:  Radiation Exposure Index (as provided by the
fluoroscopic device): Not available

If the device does not provide the exposure index:

Fluoroscopy Time:  43 seconds

Number of Acquired Images:  2
FINDINGS: Interbody fusion is noted at L5-S1. Pedicle screws are noted at both
levels.
IMPRESSION: Intraoperative localization at L5-S1 for interbody fusion.

## 2017-11-09 IMAGING — RF DG C-ARM 61-120 MIN
1 series · 3 of 3 positions shown · non-contrast
Comparison: MRI lumbar spine 08/09/2016

FLUOROSCOPY TIME:  0 minutes 38 seconds

CLINICAL DATA: L4-L5 MAS PLIF, removal of hardware at S1

EXAM:
DG C-ARM 61-120 MIN; LUMBAR SPINE - 2-3 VIEW

[Series 1: run · 3 of 3 slices shown]
[im 1/3]
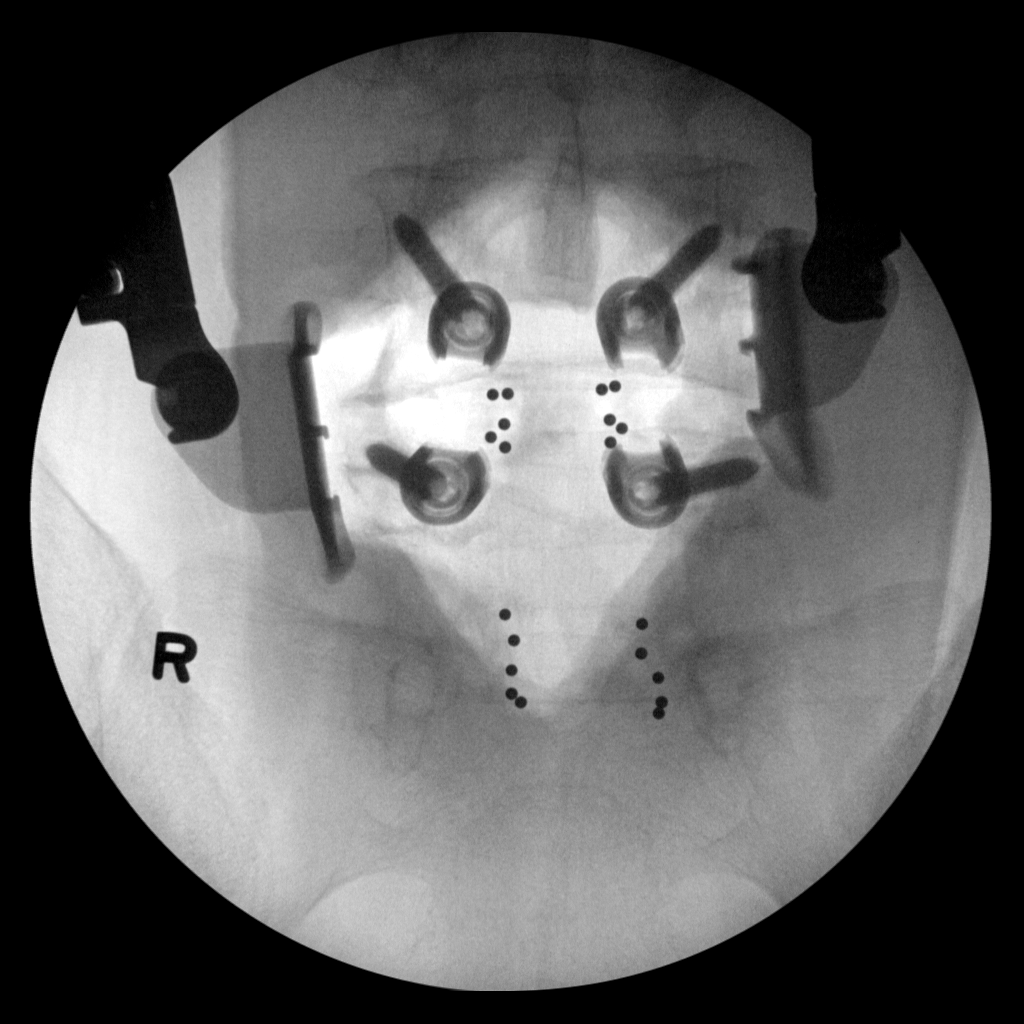
[im 2/3]
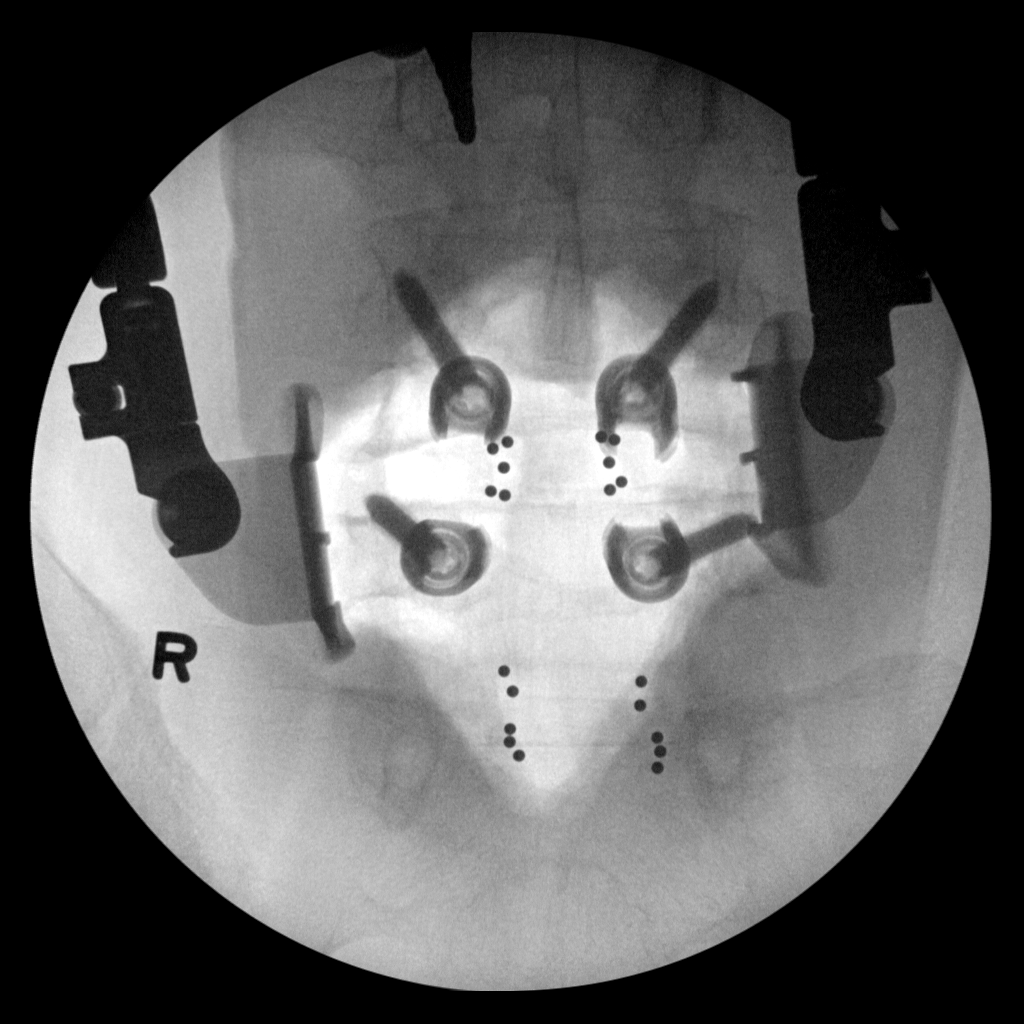
[im 3/3]
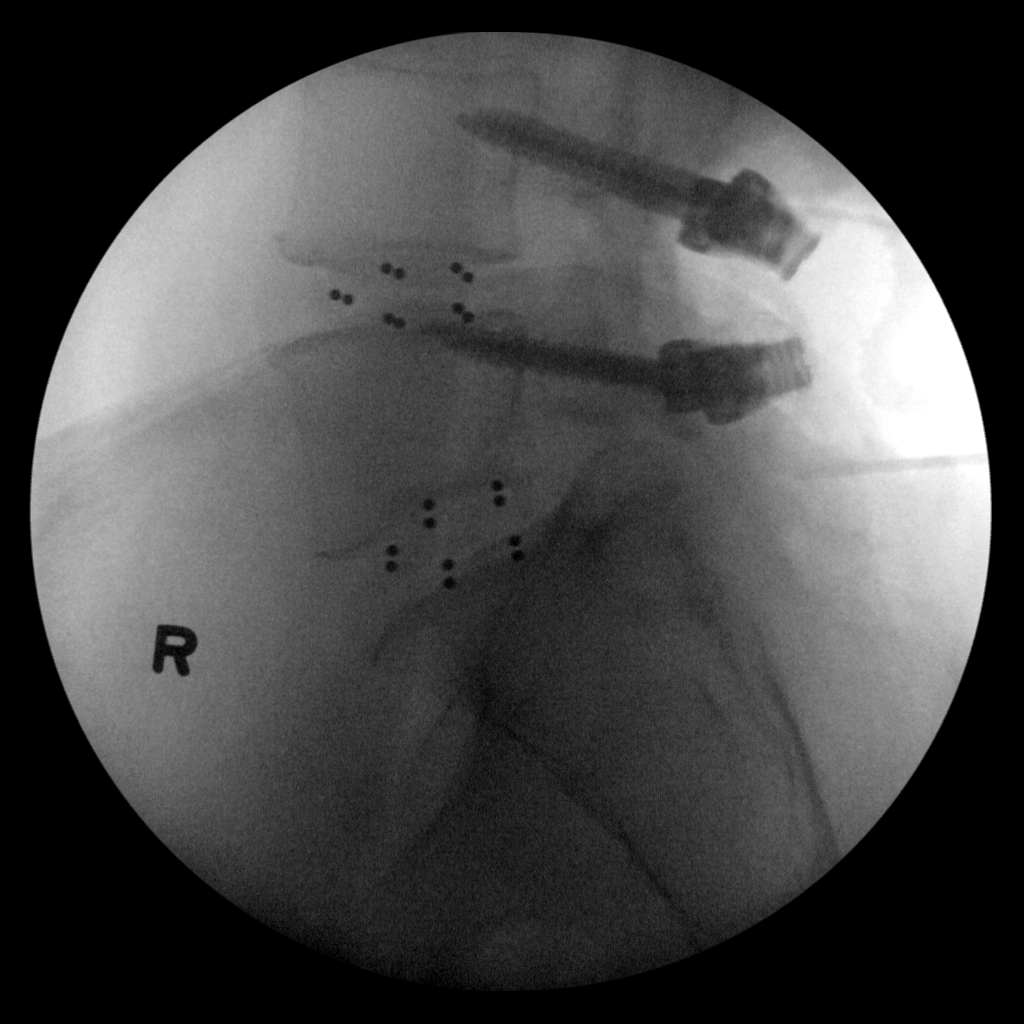

[3 of 3 positions shown; findings below may reference images not displayed]

FINDINGS: Prior MR has no labeling.

Current exam is labeled presumptively with 5 lumbar vertebra.

BILATERAL pedicle screws present at L4 and L5.

Disc prostheses at L4-L5 and L5-S1 disc spaces.

Bones appear demineralized.

No fracture or subluxation.
IMPRESSION: Intraoperative images during posterior fusion of L4-L5 as above.
